# Patient Record
Sex: Male | Born: 1965
Health system: Southern US, Community
[De-identification: ages and names within clinical notes are randomized; demographics above are authoritative.]

## PROBLEM LIST (undated history)

## (undated) DIAGNOSIS — K219 Gastro-esophageal reflux disease without esophagitis: Secondary | ICD-10-CM

## (undated) DIAGNOSIS — F17200 Nicotine dependence, unspecified, uncomplicated: Secondary | ICD-10-CM

## (undated) DIAGNOSIS — I10 Essential (primary) hypertension: Secondary | ICD-10-CM

## (undated) DIAGNOSIS — E785 Hyperlipidemia, unspecified: Secondary | ICD-10-CM

## (undated) HISTORY — DX: Hyperlipidemia, unspecified: E78.5

## (undated) HISTORY — DX: Nicotine dependence, unspecified, uncomplicated: F17.200

---

## 1998-07-29 ENCOUNTER — Emergency Department (HOSPITAL_COMMUNITY): Admission: EM | Admit: 1998-07-29 | Discharge: 1998-07-29 | Payer: Self-pay | Admitting: Emergency Medicine

## 1999-03-25 ENCOUNTER — Encounter: Payer: Self-pay | Admitting: Internal Medicine

## 1999-03-25 ENCOUNTER — Emergency Department (HOSPITAL_COMMUNITY): Admission: EM | Admit: 1999-03-25 | Discharge: 1999-03-25 | Payer: Self-pay | Admitting: Internal Medicine

## 2001-02-03 ENCOUNTER — Emergency Department (HOSPITAL_COMMUNITY): Admission: EM | Admit: 2001-02-03 | Discharge: 2001-02-03 | Payer: Self-pay | Admitting: Internal Medicine

## 2001-05-15 ENCOUNTER — Encounter: Payer: Self-pay | Admitting: Emergency Medicine

## 2001-05-15 ENCOUNTER — Emergency Department (HOSPITAL_COMMUNITY): Admission: EM | Admit: 2001-05-15 | Discharge: 2001-05-15 | Payer: Self-pay | Admitting: Emergency Medicine

## 2002-09-14 ENCOUNTER — Emergency Department (HOSPITAL_COMMUNITY): Admission: EM | Admit: 2002-09-14 | Discharge: 2002-09-14 | Payer: Self-pay | Admitting: Podiatry

## 2002-09-14 ENCOUNTER — Encounter: Payer: Self-pay | Admitting: *Deleted

## 2002-10-28 ENCOUNTER — Emergency Department (HOSPITAL_COMMUNITY): Admission: EM | Admit: 2002-10-28 | Discharge: 2002-10-28 | Payer: Self-pay | Admitting: Emergency Medicine

## 2003-12-25 ENCOUNTER — Emergency Department (HOSPITAL_COMMUNITY): Admission: EM | Admit: 2003-12-25 | Discharge: 2003-12-26 | Payer: Self-pay | Admitting: Emergency Medicine

## 2004-10-29 ENCOUNTER — Emergency Department (HOSPITAL_COMMUNITY): Admission: EM | Admit: 2004-10-29 | Discharge: 2004-10-29 | Payer: Self-pay | Admitting: Emergency Medicine

## 2004-12-02 ENCOUNTER — Emergency Department (HOSPITAL_COMMUNITY): Admission: EM | Admit: 2004-12-02 | Discharge: 2004-12-02 | Payer: Self-pay | Admitting: Emergency Medicine

## 2005-02-16 ENCOUNTER — Emergency Department (HOSPITAL_COMMUNITY): Admission: EM | Admit: 2005-02-16 | Discharge: 2005-02-17 | Payer: Self-pay | Admitting: Emergency Medicine

## 2005-05-20 ENCOUNTER — Emergency Department (HOSPITAL_COMMUNITY): Admission: EM | Admit: 2005-05-20 | Discharge: 2005-05-20 | Payer: Self-pay | Admitting: Emergency Medicine

## 2005-07-19 ENCOUNTER — Ambulatory Visit: Payer: Self-pay | Admitting: Family Medicine

## 2005-07-28 ENCOUNTER — Ambulatory Visit (HOSPITAL_COMMUNITY): Admission: RE | Admit: 2005-07-28 | Discharge: 2005-07-28 | Payer: Self-pay | Admitting: Family Medicine

## 2005-08-06 ENCOUNTER — Ambulatory Visit: Payer: Self-pay | Admitting: *Deleted

## 2005-08-17 ENCOUNTER — Emergency Department (HOSPITAL_COMMUNITY): Admission: EM | Admit: 2005-08-17 | Discharge: 2005-08-17 | Payer: Self-pay | Admitting: Emergency Medicine

## 2005-11-01 ENCOUNTER — Ambulatory Visit: Payer: Self-pay | Admitting: Nurse Practitioner

## 2006-01-22 ENCOUNTER — Emergency Department (HOSPITAL_COMMUNITY): Admission: EM | Admit: 2006-01-22 | Discharge: 2006-01-22 | Payer: Self-pay | Admitting: Emergency Medicine

## 2006-01-25 ENCOUNTER — Ambulatory Visit: Payer: Self-pay | Admitting: Family Medicine

## 2006-02-01 ENCOUNTER — Ambulatory Visit: Payer: Self-pay | Admitting: Family Medicine

## 2006-04-11 ENCOUNTER — Ambulatory Visit: Payer: Self-pay | Admitting: Family Medicine

## 2006-08-08 ENCOUNTER — Emergency Department (HOSPITAL_COMMUNITY): Admission: EM | Admit: 2006-08-08 | Discharge: 2006-08-08 | Payer: Self-pay | Admitting: Emergency Medicine

## 2006-08-08 ENCOUNTER — Ambulatory Visit: Payer: Self-pay | Admitting: Family Medicine

## 2006-08-09 ENCOUNTER — Ambulatory Visit: Payer: Self-pay | Admitting: Family Medicine

## 2006-08-10 ENCOUNTER — Emergency Department (HOSPITAL_COMMUNITY): Admission: EM | Admit: 2006-08-10 | Discharge: 2006-08-10 | Payer: Self-pay | Admitting: *Deleted

## 2006-08-15 ENCOUNTER — Ambulatory Visit: Payer: Self-pay | Admitting: Family Medicine

## 2007-06-28 ENCOUNTER — Encounter (INDEPENDENT_AMBULATORY_CARE_PROVIDER_SITE_OTHER): Payer: Self-pay | Admitting: *Deleted

## 2008-07-09 ENCOUNTER — Emergency Department (HOSPITAL_COMMUNITY): Admission: EM | Admit: 2008-07-09 | Discharge: 2008-07-09 | Payer: Self-pay | Admitting: *Deleted

## 2008-12-31 ENCOUNTER — Emergency Department (HOSPITAL_COMMUNITY): Admission: EM | Admit: 2008-12-31 | Discharge: 2008-12-31 | Payer: Self-pay | Admitting: Emergency Medicine

## 2009-12-17 ENCOUNTER — Emergency Department (HOSPITAL_COMMUNITY): Admission: EM | Admit: 2009-12-17 | Discharge: 2009-12-17 | Payer: Self-pay | Admitting: Emergency Medicine

## 2010-09-17 ENCOUNTER — Emergency Department (HOSPITAL_COMMUNITY): Admission: EM | Admit: 2010-09-17 | Discharge: 2010-07-03 | Payer: Self-pay | Admitting: Emergency Medicine

## 2010-10-31 ENCOUNTER — Encounter: Payer: Self-pay | Admitting: Family Medicine

## 2010-11-01 ENCOUNTER — Encounter: Payer: Self-pay | Admitting: Family Medicine

## 2010-12-24 LAB — URINE MICROSCOPIC-ADD ON

## 2010-12-24 LAB — URINALYSIS, ROUTINE W REFLEX MICROSCOPIC
Ketones, ur: >80 mg/dL — AB
Nitrite: NEGATIVE
Specific Gravity, Urine: 1.032 — ABNORMAL HIGH (ref 1.005–1.030)
pH: 6 (ref 5.0–8.0)

## 2010-12-24 LAB — CBC
MCH: 34.1 pg — ABNORMAL HIGH (ref 26.0–34.0)
MCHC: 34.5 g/dL (ref 30.0–36.0)
MCV: 99.1 fL (ref 78.0–100.0)
Platelets: 195 10*3/uL (ref 150–400)
RBC: 4.79 MIL/uL (ref 4.22–5.81)
RDW: 12.1 % (ref 11.5–15.5)

## 2010-12-24 LAB — URINE CULTURE: Culture: NO GROWTH

## 2010-12-24 LAB — DIFFERENTIAL
Basophils Relative: 0 % (ref 0–1)
Eosinophils Absolute: 0 10*3/uL (ref 0.0–0.7)
Eosinophils Relative: 0 % (ref 0–5)
Lymphs Abs: 2.2 10*3/uL (ref 0.7–4.0)
Neutrophils Relative %: 68 % (ref 43–77)

## 2010-12-24 LAB — COMPREHENSIVE METABOLIC PANEL
AST: 15 U/L (ref 0–37)
CO2: 26 mEq/L (ref 19–32)
Calcium: 9.7 mg/dL (ref 8.4–10.5)
Creatinine, Ser: 1.04 mg/dL (ref 0.4–1.5)
GFR calc Af Amer: >60 mL/min (ref >60)
GFR calc non Af Amer: >60 mL/min (ref >60)
Glucose, Bld: 103 mg/dL — ABNORMAL HIGH (ref 70–99)
Sodium: 138 mEq/L (ref 135–145)
Total Protein: 7.7 g/dL (ref 6.0–8.3)

## 2010-12-24 LAB — LIPASE, BLOOD: Lipase: 39 U/L (ref 11–59)

## 2011-01-21 LAB — CBC
HCT: 47.5 % (ref 39.0–52.0)
Hemoglobin: 16.1 g/dL (ref 13.0–17.0)
RBC: 4.76 MIL/uL (ref 4.22–5.81)

## 2011-01-21 LAB — COMPREHENSIVE METABOLIC PANEL
ALT: 11 U/L (ref 0–53)
Alkaline Phosphatase: 51 U/L (ref 39–117)
BUN: 13 mg/dL (ref 6–23)
CO2: 28 mEq/L (ref 19–32)
GFR calc non Af Amer: >60 mL/min (ref >60)
Glucose, Bld: 96 mg/dL (ref 70–99)
Potassium: 3.5 mEq/L (ref 3.5–5.1)
Sodium: 140 mEq/L (ref 135–145)
Total Bilirubin: 1.2 mg/dL (ref 0.3–1.2)

## 2011-01-21 LAB — LIPASE, BLOOD: Lipase: 32 U/L (ref 11–59)

## 2011-01-21 LAB — URINE CULTURE

## 2011-01-21 LAB — URINALYSIS, ROUTINE W REFLEX MICROSCOPIC
Glucose, UA: NEGATIVE mg/dL
Ketones, ur: >80 mg/dL — AB
Nitrite: NEGATIVE
Protein, ur: 30 mg/dL — AB
Urobilinogen, UA: 1 mg/dL (ref 0.0–1.0)

## 2011-01-21 LAB — URINE MICROSCOPIC-ADD ON

## 2011-01-21 LAB — DIFFERENTIAL
Basophils Absolute: 0 10*3/uL (ref 0.0–0.1)
Basophils Relative: 0 % (ref 0–1)
Eosinophils Absolute: 0 10*3/uL (ref 0.0–0.7)
Neutrophils Relative %: 72 % (ref 43–77)

## 2011-06-12 ENCOUNTER — Emergency Department (HOSPITAL_COMMUNITY)
Admission: EM | Admit: 2011-06-12 | Discharge: 2011-06-12 | Disposition: A | Discharge status: Home / Self Care | Payer: Self-pay | Attending: Emergency Medicine | Admitting: Emergency Medicine

## 2011-06-12 ENCOUNTER — Emergency Department (HOSPITAL_COMMUNITY): Payer: Self-pay

## 2011-06-12 DIAGNOSIS — R51 Headache: Secondary | ICD-10-CM | POA: Insufficient documentation

## 2011-06-12 DIAGNOSIS — S0100XA Unspecified open wound of scalp, initial encounter: Secondary | ICD-10-CM | POA: Insufficient documentation

## 2011-06-12 DIAGNOSIS — Z23 Encounter for immunization: Secondary | ICD-10-CM | POA: Insufficient documentation

## 2011-06-12 DIAGNOSIS — I10 Essential (primary) hypertension: Secondary | ICD-10-CM | POA: Insufficient documentation

## 2011-06-12 DIAGNOSIS — S0990XA Unspecified injury of head, initial encounter: Secondary | ICD-10-CM | POA: Insufficient documentation

## 2011-06-20 ENCOUNTER — Emergency Department (HOSPITAL_COMMUNITY)
Admission: EM | Admit: 2011-06-20 | Discharge: 2011-06-20 | Disposition: A | Discharge status: Home / Self Care | Payer: 59 | Attending: Emergency Medicine | Admitting: Emergency Medicine

## 2011-06-20 DIAGNOSIS — Z4802 Encounter for removal of sutures: Secondary | ICD-10-CM | POA: Insufficient documentation

## 2011-07-12 LAB — COMPREHENSIVE METABOLIC PANEL
Albumin: 4.1
BUN: 6
Calcium: 9.6
Creatinine, Ser: 1.03
Total Protein: 7.3

## 2011-07-12 LAB — DIFFERENTIAL
Basophils Absolute: 0
Lymphocytes Relative: 25
Monocytes Absolute: 0.6
Monocytes Relative: 7
Neutro Abs: 5.8
Neutrophils Relative %: 66

## 2011-07-12 LAB — URINALYSIS, ROUTINE W REFLEX MICROSCOPIC
Glucose, UA: NEGATIVE
Hgb urine dipstick: NEGATIVE
Specific Gravity, Urine: 1.016
Urobilinogen, UA: 1

## 2011-07-12 LAB — CBC
HCT: 47.8
MCHC: 33.7
MCV: 100.7 — ABNORMAL HIGH
Platelets: 187
RDW: 12.3

## 2011-07-12 LAB — URINE MICROSCOPIC-ADD ON

## 2011-08-13 ENCOUNTER — Emergency Department (HOSPITAL_COMMUNITY): Payer: 59

## 2011-08-13 ENCOUNTER — Emergency Department (HOSPITAL_COMMUNITY)
Admission: EM | Admit: 2011-08-13 | Discharge: 2011-08-13 | Disposition: A | Discharge status: Home / Self Care | Payer: 59 | Attending: Emergency Medicine | Admitting: Emergency Medicine

## 2011-08-13 DIAGNOSIS — R109 Unspecified abdominal pain: Secondary | ICD-10-CM | POA: Insufficient documentation

## 2011-08-13 DIAGNOSIS — K2970 Gastritis, unspecified, without bleeding: Secondary | ICD-10-CM | POA: Insufficient documentation

## 2011-08-13 DIAGNOSIS — K2990 Gastroduodenitis, unspecified, without bleeding: Secondary | ICD-10-CM | POA: Insufficient documentation

## 2011-08-13 LAB — CBC
HCT: 46.3 % (ref 39.0–52.0)
MCHC: 35.6 g/dL (ref 30.0–36.0)
Platelets: 197 10*3/uL (ref 150–400)
RDW: 11.9 % (ref 11.5–15.5)
WBC: 7.2 10*3/uL (ref 4.0–10.5)

## 2011-08-13 LAB — DIFFERENTIAL
Basophils Absolute: 0 10*3/uL (ref 0.0–0.1)
Basophils Relative: 1 % (ref 0–1)
Eosinophils Relative: 2 % (ref 0–5)
Lymphocytes Relative: 39 % (ref 12–46)
Monocytes Absolute: 0.7 10*3/uL (ref 0.1–1.0)

## 2011-08-13 LAB — COMPREHENSIVE METABOLIC PANEL
ALT: 9 U/L (ref 0–53)
AST: 12 U/L (ref 0–37)
Albumin: 4 g/dL (ref 3.5–5.2)
Alkaline Phosphatase: 61 U/L (ref 39–117)
BUN: 8 mg/dL (ref 6–23)
Chloride: 99 mEq/L (ref 96–112)
Potassium: 4 mEq/L (ref 3.5–5.1)
Sodium: 137 mEq/L (ref 135–145)
Total Bilirubin: 0.3 mg/dL (ref 0.3–1.2)
Total Protein: 7.3 g/dL (ref 6.0–8.3)

## 2011-08-13 LAB — LIPASE, BLOOD: Lipase: 49 U/L (ref 11–59)

## 2011-08-13 MED ORDER — IOHEXOL 300 MG/ML  SOLN
100.0000 mL | Freq: Once | INTRAMUSCULAR | Status: AC | PRN
  Administered 2011-08-13: 100 mL via INTRAVENOUS

## 2011-10-13 ENCOUNTER — Emergency Department (HOSPITAL_COMMUNITY)
Admission: EM | Admit: 2011-10-13 | Discharge: 2011-10-13 | Disposition: A | Discharge status: Home / Self Care | Payer: 59 | Attending: Emergency Medicine | Admitting: Emergency Medicine

## 2011-10-13 ENCOUNTER — Encounter: Payer: Self-pay | Admitting: *Deleted

## 2011-10-13 DIAGNOSIS — R197 Diarrhea, unspecified: Secondary | ICD-10-CM | POA: Insufficient documentation

## 2011-10-13 DIAGNOSIS — R112 Nausea with vomiting, unspecified: Secondary | ICD-10-CM | POA: Insufficient documentation

## 2011-10-13 DIAGNOSIS — F17200 Nicotine dependence, unspecified, uncomplicated: Secondary | ICD-10-CM | POA: Insufficient documentation

## 2011-10-13 DIAGNOSIS — IMO0001 Reserved for inherently not codable concepts without codable children: Secondary | ICD-10-CM | POA: Insufficient documentation

## 2011-10-13 MED ORDER — ONDANSETRON 8 MG PO TBDP
8.0000 mg | ORAL_TABLET | Freq: Once | ORAL | Status: AC
  Administered 2011-10-13: 8 mg via ORAL
  Filled 2011-10-13: qty 1

## 2011-10-13 MED ORDER — LOPERAMIDE HCL 2 MG PO CAPS
2.0000 mg | ORAL_CAPSULE | Freq: Once | ORAL | Status: AC
  Administered 2011-10-13: 2 mg via ORAL
  Filled 2011-10-13: qty 1

## 2011-10-13 MED ORDER — ONDANSETRON HCL 8 MG PO TABS: 8.0000 mg | ORAL_TABLET | Freq: Three times a day (TID) | ORAL | Status: AC | PRN

## 2011-10-13 NOTE — ED Notes (Signed)
Pt in c/o flu like symptoms, fever, chills, body aches, n/v/d

## 2011-10-13 NOTE — ED Notes (Signed)
Po fluids offered, abled to drink 240 ml fluids, tolerated well, denies nausea, denies pain (-) vomiting

## 2011-10-13 NOTE — ED Provider Notes (Signed)
History     CSN: [id number]  Arrival date & time 10/13/11  0551   First MD Initiated Contact with Patient 10/13/11 0615      Chief Complaint  Patient presents with  . Influenza    (Consider location/radiation/quality/duration/timing/severity/associated sxs/prior treatment) The history is provided by the patient.   Pt c/o nvd onset last pm. Has had a few episodes of both vomiting and diarrhea. Emesis clear, or c/w recently ingested fluids - not bloody or bilious. Diarrhea watery, not bloody. No abd pain. +body aches. No fever. No gu c/o. Says others at work w recent similar gi symptoms. No known bad food ingestion. No recent abx use. Denies cough, sore throat, or other uri symptoms.     History reviewed. No pertinent past medical history.  History reviewed. No pertinent past surgical history.  History reviewed. No pertinent family history.  History  Substance Use Topics  . Smoking status: Current Everyday Smoker  . Smokeless tobacco: Not on file  . Alcohol Use: No      Review of Systems  Constitutional: Negative for fever.  HENT: Negative for sore throat, rhinorrhea and neck pain.   Eyes: Negative for redness.  Respiratory: Negative for cough and shortness of breath.   Cardiovascular: Negative for chest pain.  Gastrointestinal: Positive for nausea, vomiting and diarrhea. Negative for abdominal pain.  Genitourinary: Negative for flank pain.  Musculoskeletal: Negative for back pain.  Skin: Negative for rash.  Neurological: Negative for headaches.  Hematological: Does not bruise/bleed easily.  Psychiatric/Behavioral: Negative for confusion.    Allergies  Review of patient's allergies indicates no known allergies.  Home Medications  No current outpatient prescriptions on file.  There were no vitals taken for this visit.  Physical Exam  Nursing note and vitals reviewed. Constitutional: He is oriented to person, place, and time. He appears well-developed and  well-nourished. No distress.  HENT:  Head: Atraumatic.  Mouth/Throat: Oropharynx is clear and moist.  Eyes: Conjunctivae are normal. Pupils are equal, round, and reactive to light.  Neck: Neck supple. No tracheal deviation present.       No stiffness or rigidity  Cardiovascular: Normal rate, regular rhythm, normal heart sounds and intact distal pulses.  Exam reveals no gallop and no friction rub.   No murmur heard. Pulmonary/Chest: Effort normal and breath sounds normal. No accessory muscle usage. No respiratory distress. He has no rales.  Abdominal: Soft. Bowel sounds are normal. He exhibits no distension and no mass. There is no tenderness. There is no rebound and no guarding.  Genitourinary:       No cva tenderness  Musculoskeletal: Normal range of motion. He exhibits no edema and no tenderness.  Neurological: He is alert and oriented to person, place, and time.  Skin: Skin is warm and dry.  Psychiatric: He has a normal mood and affect.    ED Course  Procedures (including critical care time)     MDM  zofran po. Imodium po. Po fluids. Discussed zofran, oral hydration with pt. abd soft nt. No gu c/o.         Suzi Roots, MD 10/14/11 [phone]

## 2012-04-24 ENCOUNTER — Encounter (HOSPITAL_COMMUNITY): Payer: Self-pay | Admitting: Emergency Medicine

## 2012-04-24 ENCOUNTER — Emergency Department (HOSPITAL_COMMUNITY)
Admission: EM | Admit: 2012-04-24 | Discharge: 2012-04-24 | Disposition: A | Discharge status: Home / Self Care | Payer: 59 | Attending: Emergency Medicine | Admitting: Emergency Medicine

## 2012-04-24 DIAGNOSIS — R5381 Other malaise: Secondary | ICD-10-CM | POA: Insufficient documentation

## 2012-04-24 DIAGNOSIS — R110 Nausea: Secondary | ICD-10-CM | POA: Insufficient documentation

## 2012-04-24 DIAGNOSIS — R109 Unspecified abdominal pain: Secondary | ICD-10-CM

## 2012-04-24 DIAGNOSIS — R1013 Epigastric pain: Secondary | ICD-10-CM | POA: Insufficient documentation

## 2012-04-24 DIAGNOSIS — F17200 Nicotine dependence, unspecified, uncomplicated: Secondary | ICD-10-CM | POA: Insufficient documentation

## 2012-04-24 LAB — CBC WITH DIFFERENTIAL/PLATELET
Basophils Absolute: 0 10*3/uL (ref 0.0–0.1)
Basophils Relative: 1 % (ref 0–1)
Eosinophils Relative: 2 % (ref 0–5)
HCT: 43.4 % (ref 39.0–52.0)
Hemoglobin: 15.2 g/dL (ref 13.0–17.0)
MCH: 33.6 pg (ref 26.0–34.0)
MCHC: 35 g/dL (ref 30.0–36.0)
MCV: 96 fL (ref 78.0–100.0)
Monocytes Absolute: 0.6 10*3/uL (ref 0.1–1.0)
Monocytes Relative: 9 % (ref 3–12)
Neutro Abs: 3.5 10*3/uL (ref 1.7–7.7)
RDW: 11.7 % (ref 11.5–15.5)

## 2012-04-24 LAB — LIPASE, BLOOD: Lipase: 39 U/L (ref 11–59)

## 2012-04-24 LAB — COMPREHENSIVE METABOLIC PANEL
Albumin: 3.7 g/dL (ref 3.5–5.2)
BUN: 8 mg/dL (ref 6–23)
CO2: 27 mEq/L (ref 19–32)
Calcium: 9.1 mg/dL (ref 8.4–10.5)
Chloride: 104 mEq/L (ref 96–112)
Creatinine, Ser: 1.09 mg/dL (ref 0.50–1.35)
GFR calc non Af Amer: 81 mL/min — ABNORMAL LOW (ref >90)
Total Bilirubin: 0.2 mg/dL — ABNORMAL LOW (ref 0.3–1.2)

## 2012-04-24 MED ORDER — ONDANSETRON HCL 4 MG/2ML IJ SOLN
4.0000 mg | Freq: Once | INTRAMUSCULAR | Status: AC
  Administered 2012-04-24: 4 mg via INTRAVENOUS
  Filled 2012-04-24: qty 2

## 2012-04-24 MED ORDER — SODIUM CHLORIDE 0.9 % IV BOLUS (SEPSIS)
1000.0000 mL | Freq: Once | INTRAVENOUS | Status: AC
  Administered 2012-04-24: 1000 mL via INTRAVENOUS

## 2012-04-24 MED ORDER — HYDROCODONE-ACETAMINOPHEN 5-500 MG PO TABS: 1.0000 | ORAL_TABLET | Freq: Four times a day (QID) | ORAL | Status: AC | PRN

## 2012-04-24 MED ORDER — GI COCKTAIL ~~LOC~~
30.0000 mL | Freq: Once | ORAL | Status: AC
  Administered 2012-04-24: 30 mL via ORAL
  Filled 2012-04-24: qty 30

## 2012-04-24 MED ORDER — PANTOPRAZOLE SODIUM 40 MG IV SOLR
40.0000 mg | Freq: Once | INTRAVENOUS | Status: AC
  Administered 2012-04-24: 40 mg via INTRAVENOUS
  Filled 2012-04-24: qty 40

## 2012-04-24 MED ORDER — FAMOTIDINE 20 MG PO TABS: 20.0000 mg | ORAL_TABLET | Freq: Two times a day (BID) | ORAL | Status: DC

## 2012-04-24 MED ORDER — MORPHINE SULFATE 4 MG/ML IJ SOLN
4.0000 mg | Freq: Once | INTRAMUSCULAR | Status: AC
  Administered 2012-04-24: 4 mg via INTRAVENOUS
  Filled 2012-04-24: qty 1

## 2012-04-24 MED ORDER — FAMOTIDINE IN NACL 20-0.9 MG/50ML-% IV SOLN
20.0000 mg | Freq: Once | INTRAVENOUS | Status: AC
  Administered 2012-04-24: 20 mg via INTRAVENOUS
  Filled 2012-04-24: qty 50

## 2012-04-24 NOTE — ED Provider Notes (Signed)
Medical screening examination/treatment/procedure(s) were performed by non-physician practitioner and as supervising physician I was immediately available for consultation/collaboration.   Dione Booze, MD 04/24/12 [phone]

## 2012-04-24 NOTE — ED Notes (Signed)
Pt reports he was having abd pain. Denies pain at moment. Reports his pain comes and goes.

## 2012-04-24 NOTE — ED Notes (Signed)
Pt. Resting with eyes closed. 

## 2012-04-24 NOTE — ED Provider Notes (Signed)
History     CSN: [id number]  Arrival date & time 04/24/12  0704   First MD Initiated Contact with Patient 04/24/12 [phone]      Chief Complaint  Patient presents with  . Abdominal Pain    (Consider location/radiation/quality/duration/timing/severity/associated sxs/prior treatment) Patient is a 46 y.o. male presenting with abdominal pain. The history is provided by the patient.  Abdominal Pain The primary symptoms of the illness include abdominal pain, fatigue and nausea. The primary symptoms of the illness do not include fever, shortness of breath, vomiting, diarrhea, hematemesis or dysuria. The current episode started more than 2 days ago. The onset of the illness was gradual. The problem has been gradually worsening.  Symptoms associated with the illness do not include chills, hematuria or frequency.  States pain in the epigastric area. States no alcohol or NSAID use. Hx of the same, last year, states was told he had gastritis. takeing prilosec with no improvement. States pain for 3 days now, worsening. Not eating. Stool normal color. No fever or vomiting.   History reviewed. No pertinent past medical history.  History reviewed. No pertinent past surgical history.  History reviewed. No pertinent family history.  History  Substance Use Topics  . Smoking status: Current Everyday Smoker  . Smokeless tobacco: Not on file  . Alcohol Use: No      Review of Systems  Constitutional: Positive for fatigue. Negative for fever and chills.  HENT: Negative for neck pain and neck stiffness.   Respiratory: Negative for cough, chest tightness and shortness of breath.   Cardiovascular: Negative.   Gastrointestinal: Positive for nausea and abdominal pain. Negative for vomiting, diarrhea and hematemesis.  Genitourinary: Negative for dysuria, frequency, hematuria, flank pain and penile pain.  Musculoskeletal: Positive for myalgias.  Skin: Negative.   Neurological: Negative for dizziness,  weakness and numbness.    Allergies  Review of patient's allergies indicates no known allergies.  Home Medications   Current Outpatient Rx  Name Route Sig Dispense Refill  . OMEPRAZOLE 20 MG PO CPDR Oral Take 20 mg by mouth daily.      BP 178/94  Pulse 59  Temp 98.2 F (36.8 C) (Oral)  Resp 16  SpO2 100%  Physical Exam  Nursing note and vitals reviewed. Constitutional: He is oriented to person, place, and time. He appears well-developed and well-nourished. No distress.  Eyes: Conjunctivae are normal.  Neck: Neck supple.  Cardiovascular: Normal rate, regular rhythm and normal heart sounds.   Pulmonary/Chest: Effort normal and breath sounds normal. No respiratory distress. He has no wheezes. He has no rales.  Abdominal: Soft. Bowel sounds are normal. There is no rebound.       Epigastric and LUQ tenderness, guarding  Musculoskeletal: Normal range of motion. He exhibits no edema.  Neurological: He is alert and oriented to person, place, and time.  Skin: Skin is warm.  Psychiatric: He has a normal mood and affect.    ED Course  Procedures (including critical care time)  Epigastric abdominal pain, hx of gastritis. Will get labs, meds ordered.   Results for orders placed during the hospital encounter of 04/24/12  CBC WITH DIFFERENTIAL      Component Value Range   WBC 6.8  4.0 - 10.5 K/uL   RBC 4.52  4.22 - 5.81 MIL/uL   Hemoglobin 15.2  13.0 - 17.0 g/dL   HCT 11.9  14.7 - 82.9 %   MCV 96.0  78.0 - 100.0 fL   MCH 33.6  26.0 -  34.0 pg   MCHC 35.0  30.0 - 36.0 g/dL   RDW 16.1  09.6 - 04.5 %   Platelets 199  150 - 400 K/uL   Neutrophils Relative 51  43 - 77 %   Neutro Abs 3.5  1.7 - 7.7 K/uL   Lymphocytes Relative 37  12 - 46 %   Lymphs Abs 2.5  0.7 - 4.0 K/uL   Monocytes Relative 9  3 - 12 %   Monocytes Absolute 0.6  0.1 - 1.0 K/uL   Eosinophils Relative 2  0 - 5 %   Eosinophils Absolute 0.1  0.0 - 0.7 K/uL   Basophils Relative 1  0 - 1 %   Basophils Absolute 0.0   0.0 - 0.1 K/uL  COMPREHENSIVE METABOLIC PANEL      Component Value Range   Sodium 141  135 - 145 mEq/L   Potassium 3.8  3.5 - 5.1 mEq/L   Chloride 104  96 - 112 mEq/L   CO2 27  19 - 32 mEq/L   Glucose, Bld 92  70 - 99 mg/dL   BUN 8  6 - 23 mg/dL   Creatinine, Ser 4.09  0.50 - 1.35 mg/dL   Calcium 9.1  8.4 - 81.1 mg/dL   Total Protein 6.8  6.0 - 8.3 g/dL   Albumin 3.7  3.5 - 5.2 g/dL   AST 11  0 - 37 U/L   ALT 7  0 - 53 U/L   Alkaline Phosphatase 56  39 - 117 U/L   Total Bilirubin 0.2 (*) 0.3 - 1.2 mg/dL   GFR calc non Af Amer 81 (*) >90 mL/min   GFR calc Af Amer >90  >90 mL/min  LIPASE, BLOOD      Component Value Range   Lipase 39  11 - 59 U/L   Pt's pain comes and goes, only lasting few minutes at a time then resolves. Doubt cholecystitis or pancreatitis given this type of pain, normal VS and normal labs. Pain is currently 0/10. Pt comfortable going home. Will d/c home with pain medications and follow up. Abdomen re examine at time of discharge and is non tender. No acute abdomen.    1. Abdominal pain       MDM         Lottie Mussel, PA 04/24/12 1539

## 2012-04-24 NOTE — ED Notes (Signed)
Pt. Stated, I've had abd. Pain since Friday that really hurts. Normal BM , no  Urinary problem.

## 2012-06-07 ENCOUNTER — Encounter (HOSPITAL_COMMUNITY): Payer: Self-pay | Admitting: Family Medicine

## 2012-06-07 ENCOUNTER — Emergency Department (HOSPITAL_COMMUNITY)
Admission: EM | Admit: 2012-06-07 | Discharge: 2012-06-07 | Disposition: A | Discharge status: Home / Self Care | Payer: 59 | Attending: Emergency Medicine | Admitting: Emergency Medicine

## 2012-06-07 DIAGNOSIS — R112 Nausea with vomiting, unspecified: Secondary | ICD-10-CM | POA: Insufficient documentation

## 2012-06-07 DIAGNOSIS — Z79899 Other long term (current) drug therapy: Secondary | ICD-10-CM | POA: Insufficient documentation

## 2012-06-07 DIAGNOSIS — K219 Gastro-esophageal reflux disease without esophagitis: Secondary | ICD-10-CM | POA: Insufficient documentation

## 2012-06-07 DIAGNOSIS — R1013 Epigastric pain: Secondary | ICD-10-CM | POA: Insufficient documentation

## 2012-06-07 DIAGNOSIS — F17200 Nicotine dependence, unspecified, uncomplicated: Secondary | ICD-10-CM | POA: Insufficient documentation

## 2012-06-07 DIAGNOSIS — R109 Unspecified abdominal pain: Secondary | ICD-10-CM

## 2012-06-07 LAB — COMPREHENSIVE METABOLIC PANEL
Albumin: 4.4 g/dL (ref 3.5–5.2)
Alkaline Phosphatase: 68 U/L (ref 39–117)
BUN: 13 mg/dL (ref 6–23)
CO2: 29 mEq/L (ref 19–32)
Chloride: 102 mEq/L (ref 96–112)
Creatinine, Ser: 1.16 mg/dL (ref 0.50–1.35)
GFR calc Af Amer: 87 mL/min — ABNORMAL LOW (ref >90)
GFR calc non Af Amer: 75 mL/min — ABNORMAL LOW (ref >90)
Glucose, Bld: 110 mg/dL — ABNORMAL HIGH (ref 70–99)
Potassium: 3.8 mEq/L (ref 3.5–5.1)
Total Bilirubin: 0.5 mg/dL (ref 0.3–1.2)

## 2012-06-07 LAB — CBC WITH DIFFERENTIAL/PLATELET
Basophils Relative: 0 % (ref 0–1)
HCT: 48.8 % (ref 39.0–52.0)
Hemoglobin: 17.3 g/dL — ABNORMAL HIGH (ref 13.0–17.0)
Lymphocytes Relative: 28 % (ref 12–46)
Lymphs Abs: 2.7 10*3/uL (ref 0.7–4.0)
MCHC: 35.5 g/dL (ref 30.0–36.0)
Monocytes Absolute: 0.9 10*3/uL (ref 0.1–1.0)
Monocytes Relative: 9 % (ref 3–12)
Neutro Abs: 6.2 10*3/uL (ref 1.7–7.7)
Neutrophils Relative %: 63 % (ref 43–77)
RBC: 5.14 MIL/uL (ref 4.22–5.81)

## 2012-06-07 LAB — URINALYSIS, ROUTINE W REFLEX MICROSCOPIC
Glucose, UA: NEGATIVE mg/dL
Ketones, ur: 40 mg/dL — AB
Protein, ur: 30 mg/dL — AB
Urobilinogen, UA: 1 mg/dL (ref 0.0–1.0)

## 2012-06-07 LAB — URINE MICROSCOPIC-ADD ON

## 2012-06-07 LAB — LIPASE, BLOOD: Lipase: 36 U/L (ref 11–59)

## 2012-06-07 MED ORDER — ONDANSETRON 4 MG PO TBDP
4.0000 mg | ORAL_TABLET | Freq: Once | ORAL | Status: AC
  Administered 2012-06-07: 4 mg via ORAL

## 2012-06-07 MED ORDER — SODIUM CHLORIDE 0.9 % IV BOLUS (SEPSIS)
1000.0000 mL | Freq: Once | INTRAVENOUS | Status: AC
Start: 2012-06-07 — End: 2012-06-07
  Administered 2012-06-07: 1000 mL via INTRAVENOUS

## 2012-06-07 MED ORDER — HYDROMORPHONE HCL PF 1 MG/ML IJ SOLN
1.0000 mg | Freq: Once | INTRAMUSCULAR | Status: AC
  Administered 2012-06-07: 1 mg via INTRAVENOUS
  Filled 2012-06-07: qty 1

## 2012-06-07 MED ORDER — PANTOPRAZOLE SODIUM 40 MG PO TBEC
40.0000 mg | DELAYED_RELEASE_TABLET | Freq: Every day | ORAL | Status: DC
  Administered 2012-06-07: 40 mg via ORAL
  Filled 2012-06-07: qty 1

## 2012-06-07 MED ORDER — OMEPRAZOLE 20 MG PO CPDR: 20.0000 mg | DELAYED_RELEASE_CAPSULE | Freq: Every day | ORAL | Status: DC

## 2012-06-07 MED ORDER — ONDANSETRON 4 MG PO TBDP
ORAL_TABLET | ORAL | Status: AC
  Filled 2012-06-07: qty 1

## 2012-06-07 NOTE — ED Notes (Signed)
Pt actively vomiting in waiting room; zofran given.

## 2012-06-07 NOTE — ED Notes (Signed)
Pt unable to urinate at this time.  Urinal and specimen cup at bedside.  States will try in a while

## 2012-06-07 NOTE — ED Provider Notes (Signed)
History     CSN: [id number]  Arrival date & time 06/07/12  1600   First MD Initiated Contact with Patient 06/07/12 2059      Chief Complaint  Patient presents with  . Abdominal Pain    (Consider location/radiation/quality/duration/timing/severity/associated sxs/prior treatment) HPI Comments: Todd Allen 46 y.o. male   The chief complaint is: Patient presents with:   Abdominal Pain   The patient has medical history significant for:   History reviewed. No pertinent past medical history.  Patient presents with a complain for abdominal pain, nausea, and vomiting present for two days. Associated symptoms include anorexia. Patient was seen in ED by Dr. Preston Fleeting for similar complain who prescribed him omeprazole at discharge. Patient states that when he takes omeprazole he feels fine, but if he runs out the pain comes back. Pain is rated 10/10 without radiation or transmission. Denies hematemeses, melena, or hematochezia. Denies change in bowel pattern. Denies abdominal surgeries. Reports subjective fever and chills. Denies CP, palpitations or SOB.       The history is provided by the patient.    History reviewed. No pertinent past medical history.  History reviewed. No pertinent past surgical history.  History reviewed. No pertinent family history.  History  Substance Use Topics  . Smoking status: Current Everyday Smoker  . Smokeless tobacco: Not on file  . Alcohol Use: No      Review of Systems  Constitutional: Positive for fever and chills.  Respiratory: Negative for shortness of breath.   Cardiovascular: Negative for chest pain and palpitations.  Gastrointestinal: Positive for nausea, vomiting and abdominal pain. Negative for diarrhea, constipation, blood in stool and anal bleeding.  All other systems reviewed and are negative.    Allergies  Review of patient's allergies indicates no known allergies.  Home Medications   Current Outpatient Rx  Name Route  Sig Dispense Refill  . ACETAMINOPHEN-CODEINE #3 300-30 MG PO TABS Oral Take 1 tablet by mouth every 4 (four) hours as needed. For pain    . OMEPRAZOLE 20 MG PO CPDR Oral Take 20 mg by mouth daily.      BP 147/82  Pulse 55  Temp 98.7 F (37.1 C) (Oral)  Resp 18  SpO2 98%  Physical Exam  Nursing note and vitals reviewed. Constitutional: He appears well-developed and well-nourished. He appears distressed.  HENT:  Head: Normocephalic and atraumatic.  Mouth/Throat: Oropharynx is clear and moist.  Eyes: Conjunctivae and EOM are normal. No scleral icterus.  Neck: Normal range of motion. Neck supple.  Cardiovascular: Normal rate, regular rhythm and normal heart sounds.   Pulmonary/Chest: Effort normal and breath sounds normal.  Abdominal: Soft. Bowel sounds are normal. There is tenderness.       Tenderness and guarding to palpation of the epigastrium.   Neurological: He is alert.  Skin: Skin is warm and dry.    ED Course  Procedures (including critical care time)  Labs Reviewed  CBC WITH DIFFERENTIAL - Abnormal; Notable for the following:    Hemoglobin 17.3 (*)     All other components within normal limits  COMPREHENSIVE METABOLIC PANEL - Abnormal; Notable for the following:    Glucose, Bld 110 (*)     GFR calc non Af Amer 75 (*)     GFR calc Af Amer 87 (*)     All other components within normal limits  URINALYSIS, ROUTINE W REFLEX MICROSCOPIC  LIPASE, BLOOD   Results for orders placed during the hospital encounter of 06/07/12  CBC  WITH DIFFERENTIAL      Component Value Range   WBC 9.9  4.0 - 10.5 K/uL   RBC 5.14  4.22 - 5.81 MIL/uL   Hemoglobin 17.3 (*) 13.0 - 17.0 g/dL   HCT 24.4  01.0 - 27.2 %   MCV 94.9  78.0 - 100.0 fL   MCH 33.7  26.0 - 34.0 pg   MCHC 35.5  30.0 - 36.0 g/dL   RDW 53.6  64.4 - 03.4 %   Platelets 208  150 - 400 K/uL   Neutrophils Relative 63  43 - 77 %   Neutro Abs 6.2  1.7 - 7.7 K/uL   Lymphocytes Relative 28  12 - 46 %   Lymphs Abs 2.7  0.7 -  4.0 K/uL   Monocytes Relative 9  3 - 12 %   Monocytes Absolute 0.9  0.1 - 1.0 K/uL   Eosinophils Relative 0  0 - 5 %   Eosinophils Absolute 0.0  0.0 - 0.7 K/uL   Basophils Relative 0  0 - 1 %   Basophils Absolute 0.0  0.0 - 0.1 K/uL  COMPREHENSIVE METABOLIC PANEL      Component Value Range   Sodium 141  135 - 145 mEq/L   Potassium 3.8  3.5 - 5.1 mEq/L   Chloride 102  96 - 112 mEq/L   CO2 29  19 - 32 mEq/L   Glucose, Bld 110 (*) 70 - 99 mg/dL   BUN 13  6 - 23 mg/dL   Creatinine, Ser 7.42  0.50 - 1.35 mg/dL   Calcium 59.5  8.4 - 63.8 mg/dL   Total Protein 8.0  6.0 - 8.3 g/dL   Albumin 4.4  3.5 - 5.2 g/dL   AST 14  0 - 37 U/L   ALT 9  0 - 53 U/L   Alkaline Phosphatase 68  39 - 117 U/L   Total Bilirubin 0.5  0.3 - 1.2 mg/dL   GFR calc non Af Amer 75 (*) >90 mL/min   GFR calc Af Amer 87 (*) >90 mL/min  URINALYSIS, ROUTINE W REFLEX MICROSCOPIC      Component Value Range   Color, Urine AMBER (*) YELLOW   APPearance CLOUDY (*) CLEAR   Specific Gravity, Urine 1.029  1.005 - 1.030   pH 6.0  5.0 - 8.0   Glucose, UA NEGATIVE  NEGATIVE mg/dL   Hgb urine dipstick SMALL (*) NEGATIVE   Bilirubin Urine SMALL (*) NEGATIVE   Ketones, ur 40 (*) NEGATIVE mg/dL   Protein, ur 30 (*) NEGATIVE mg/dL   Urobilinogen, UA 1.0  0.0 - 1.0 mg/dL   Nitrite NEGATIVE  NEGATIVE   Leukocytes, UA SMALL (*) NEGATIVE  LIPASE, BLOOD      Component Value Range   Lipase 36  11 - 59 U/L  URINE MICROSCOPIC-ADD ON      Component Value Range   Squamous Epithelial / LPF RARE  RARE   WBC, UA 7-10  <3 WBC/hpf   RBC / HPF 7-10  <3 RBC/hpf   Bacteria, UA RARE  RARE   Urine-Other MUCOUS PRESENT      No results found.   1. Abdominal pain   2. GERD (gastroesophageal reflux disease)       MDM  Patient presented with epigastric abdominal pain rated 10/10. Patient given pain and antinausea medication in ED. Patient assessed and no longer tender to palpation, in pain, or nauseous. CBC,CMP, Lipase: unremarkable.  Patient discharged on omeprazole with sufficient refills.  No red flags for PUD or pancreatitis.         Pixie Casino, PA-C 06/07/12 2312

## 2012-06-07 NOTE — ED Notes (Signed)
Per pt sts abdominal pain since last night associated with nausea, vomiting. sts pain in epigastric area. sts last BM a few days ago.

## 2012-06-08 NOTE — ED Provider Notes (Signed)
Medical screening examination/treatment/procedure(s) were performed by non-physician practitioner and as supervising physician I was immediately available for consultation/collaboration.   Gwyneth Sprout, MD 06/08/12 2212

## 2012-08-18 ENCOUNTER — Encounter (HOSPITAL_COMMUNITY): Payer: Self-pay | Admitting: Emergency Medicine

## 2012-08-18 ENCOUNTER — Emergency Department (INDEPENDENT_AMBULATORY_CARE_PROVIDER_SITE_OTHER)
Admission: EM | Admit: 2012-08-18 | Discharge: 2012-08-18 | Disposition: A | Discharge status: Home / Self Care | Payer: 59 | Source: Home / Self Care | Attending: Family Medicine | Admitting: Family Medicine

## 2012-08-18 DIAGNOSIS — J069 Acute upper respiratory infection, unspecified: Secondary | ICD-10-CM

## 2012-08-18 HISTORY — DX: Gastro-esophageal reflux disease without esophagitis: K21.9

## 2012-08-18 LAB — POCT RAPID STREP A: Streptococcus, Group A Screen (Direct): NEGATIVE

## 2012-08-18 MED ORDER — IBUPROFEN 600 MG PO TABS: 600.0000 mg | ORAL_TABLET | Freq: Three times a day (TID) | ORAL | Status: DC

## 2012-08-18 MED ORDER — CETIRIZINE-PSEUDOEPHEDRINE ER 5-120 MG PO TB12: 1.0000 | ORAL_TABLET | Freq: Two times a day (BID) | ORAL | Status: DC

## 2012-08-18 NOTE — ED Notes (Signed)
Pt c/o sore throat x4 days... Denies: fevers, vomiting, nauseas, diarrhea, cough, congestion... Pt is alert w/no signs of distress.

## 2012-08-18 NOTE — ED Provider Notes (Signed)
History     CSN: [id number]  Arrival date & time 08/18/12  1050   First MD Initiated Contact with Patient 08/18/12 1100      Chief Complaint  Patient presents with  . Sore Throat    (Consider location/radiation/quality/duration/timing/severity/associated sxs/prior treatment) HPI Comments: 46 year old smoker male with no significant past medical history. Here complaining of sore throat for 3 days. Denies fever or chills. No abdominal pain nausea vomiting or diarrhea. No headache. No nasal congestion or cough. No rash. No shortness of breath or chest pain. 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 Tolerating solids and fluids. Patient is not allergic to any medications. Has taken sore throat lozenges without significant relief.    Past Medical History  Diagnosis Date  . Acid reflux     History reviewed. No pertinent past surgical history.  No family history on file.  History  Substance Use Topics  . Smoking status: Current Every Day Smoker -- 0.5 packs/day    Types: Cigarettes  . Smokeless tobacco: Not on file  . Alcohol Use: No      Review of Systems  Constitutional: Negative for fever, chills, diaphoresis, activity change, appetite change and fatigue.  HENT: Positive for sore throat. Negative for ear pain, congestion and rhinorrhea.   Eyes: Negative for discharge.  Respiratory: Negative for cough and shortness of breath.   Cardiovascular: Negative for chest pain.  Gastrointestinal: Negative for nausea, vomiting, abdominal pain and diarrhea.  Skin: Negative for rash.  Neurological: Negative for dizziness and headaches.  All other systems reviewed and are negative.    Allergies  Review of patient's allergies indicates no known allergies.  Home Medications   Current Outpatient Rx  Name  Route  Sig  Dispense  Refill  . OMEPRAZOLE 20 MG PO CPDR   Oral   Take 1 capsule (20 mg total) by mouth daily.   30 capsule   3   . ACETAMINOPHEN-CODEINE #3  300-30 MG PO TABS   Oral   Take 1 tablet by mouth every 4 (four) hours as needed. For pain         . CETIRIZINE-PSEUDOEPHEDRINE ER 5-120 MG PO TB12   Oral   Take 1 tablet by mouth 2 (two) times daily.   30 tablet   0   . IBUPROFEN 600 MG PO TABS   Oral   Take 1 tablet (600 mg total) by mouth 3 (three) times daily.   30 tablet   0   . OMEPRAZOLE 20 MG PO CPDR   Oral   Take 20 mg by mouth daily.           BP 123/79  Pulse 69  Temp 98.3 F (36.8 C) (Oral)  Resp 20  SpO2 100%  Physical Exam  Nursing note and vitals reviewed. Constitutional: He is oriented to person, place, and time. He appears well-developed and well-nourished. No distress.  HENT:  Head: Normocephalic.       Nasal Congestion with erythema and swelling of nasal turbinates, clear rhinorrhea. Significant pharyngeal erythema no exudates. No uvula deviation. No trismus. TM's normal   Eyes: Conjunctivae normal are normal. Right eye exhibits no discharge. Left eye exhibits no discharge.  Cardiovascular: Normal rate, regular rhythm and normal heart sounds.   Pulmonary/Chest: Effort normal and breath sounds normal.  Abdominal: Soft.       No hepatosplenomegaly  Lymphadenopathy:    He has no cervical adenopathy.  Neurological: He is alert and oriented to person, place, and  time.  Skin: No rash noted.    ED Course  Procedures (including critical care time)   Labs Reviewed  POCT RAPID STREP A ([facility] CARE ONLY)  LAB REPORT - SCANNED   No results found.   1. URI (upper respiratory infection)       MDM  Negative rapid strep test. Clinically well. Treated symptomatically. Prescribed ibuprofen and Zyrtec-D. Supportive care and red flags that should prompt his return to medical attention discussed with patient and provided in writing.        Sharin Grave, MD 08/20/12 [phone]

## 2012-10-08 ENCOUNTER — Emergency Department (HOSPITAL_COMMUNITY)
Admission: EM | Admit: 2012-10-08 | Discharge: 2012-10-08 | Disposition: A | Discharge status: Home / Self Care | Payer: Self-pay | Attending: Emergency Medicine | Admitting: Emergency Medicine

## 2012-10-08 ENCOUNTER — Encounter (HOSPITAL_COMMUNITY): Payer: Self-pay

## 2012-10-08 DIAGNOSIS — E860 Dehydration: Secondary | ICD-10-CM | POA: Insufficient documentation

## 2012-10-08 DIAGNOSIS — Z79899 Other long term (current) drug therapy: Secondary | ICD-10-CM | POA: Insufficient documentation

## 2012-10-08 DIAGNOSIS — K5289 Other specified noninfective gastroenteritis and colitis: Secondary | ICD-10-CM | POA: Insufficient documentation

## 2012-10-08 DIAGNOSIS — F17200 Nicotine dependence, unspecified, uncomplicated: Secondary | ICD-10-CM | POA: Insufficient documentation

## 2012-10-08 DIAGNOSIS — R509 Fever, unspecified: Secondary | ICD-10-CM | POA: Insufficient documentation

## 2012-10-08 DIAGNOSIS — K219 Gastro-esophageal reflux disease without esophagitis: Secondary | ICD-10-CM | POA: Insufficient documentation

## 2012-10-08 DIAGNOSIS — K529 Noninfective gastroenteritis and colitis, unspecified: Secondary | ICD-10-CM

## 2012-10-08 DIAGNOSIS — Z7982 Long term (current) use of aspirin: Secondary | ICD-10-CM | POA: Insufficient documentation

## 2012-10-08 LAB — URINALYSIS, MICROSCOPIC ONLY
Glucose, UA: NEGATIVE mg/dL
Hgb urine dipstick: NEGATIVE
Ketones, ur: >80 mg/dL — AB
Nitrite: NEGATIVE
Protein, ur: 30 mg/dL — AB
Specific Gravity, Urine: 1.036 — ABNORMAL HIGH (ref 1.005–1.030)
Urobilinogen, UA: 1 mg/dL (ref 0.0–1.0)
pH: 6 (ref 5.0–8.0)

## 2012-10-08 LAB — POCT I-STAT TROPONIN I: Troponin i, poc: 0 ng/mL (ref 0.00–0.08)

## 2012-10-08 LAB — CBC WITH DIFFERENTIAL/PLATELET
Basophils Absolute: 0 10*3/uL (ref 0.0–0.1)
Basophils Relative: 0 % (ref 0–1)
Eosinophils Absolute: 0 K/uL (ref 0.0–0.7)
Eosinophils Relative: 0 % (ref 0–5)
HCT: 46.4 % (ref 39.0–52.0)
Hemoglobin: 16.3 g/dL (ref 13.0–17.0)
Lymphocytes Relative: 22 % (ref 12–46)
Lymphs Abs: 2 10*3/uL (ref 0.7–4.0)
MCH: 33.3 pg (ref 26.0–34.0)
MCHC: 35.1 g/dL (ref 30.0–36.0)
MCV: 94.9 fL (ref 78.0–100.0)
Monocytes Absolute: 0.7 K/uL (ref 0.1–1.0)
Monocytes Relative: 8 % (ref 3–12)
Neutro Abs: 6.4 10*3/uL (ref 1.7–7.7)
Neutrophils Relative %: 70 % (ref 43–77)
Platelets: 191 10*3/uL (ref 150–400)
RBC: 4.89 MIL/uL (ref 4.22–5.81)
RDW: 12.1 % (ref 11.5–15.5)
WBC: 9.1 10*3/uL (ref 4.0–10.5)

## 2012-10-08 LAB — COMPREHENSIVE METABOLIC PANEL
ALT: 8 U/L (ref 0–53)
AST: 12 U/L (ref 0–37)
Alkaline Phosphatase: 69 U/L (ref 39–117)
CO2: 26 mEq/L (ref 19–32)
Chloride: 100 mEq/L (ref 96–112)
GFR calc non Af Amer: >90 mL/min (ref >90)
Glucose, Bld: 100 mg/dL — ABNORMAL HIGH (ref 70–99)
Sodium: 141 mEq/L (ref 135–145)
Total Bilirubin: 0.6 mg/dL (ref 0.3–1.2)

## 2012-10-08 LAB — COMPREHENSIVE METABOLIC PANEL WITH GFR
Albumin: 4.3 g/dL (ref 3.5–5.2)
BUN: 11 mg/dL (ref 6–23)
Calcium: 10 mg/dL (ref 8.4–10.5)
Creatinine, Ser: 0.95 mg/dL (ref 0.50–1.35)
GFR calc Af Amer: >90 mL/min (ref >90)
Potassium: 3.4 meq/L — ABNORMAL LOW (ref 3.5–5.1)
Total Protein: 7.9 g/dL (ref 6.0–8.3)

## 2012-10-08 LAB — LIPASE, BLOOD: Lipase: 29 U/L (ref 11–59)

## 2012-10-08 LAB — CG4 I-STAT (LACTIC ACID): Lactic Acid, Venous: 1.57 mmol/L (ref 0.5–2.2)

## 2012-10-08 MED ORDER — GI COCKTAIL ~~LOC~~
30.0000 mL | Freq: Once | ORAL | Status: AC
  Administered 2012-10-08: 30 mL via ORAL
  Filled 2012-10-08: qty 30

## 2012-10-08 MED ORDER — SODIUM CHLORIDE 0.9 % IV SOLN: 1000.0000 mL | INTRAVENOUS | Status: DC

## 2012-10-08 MED ORDER — OXYCODONE-ACETAMINOPHEN 5-325 MG PO TABS
1.0000 | ORAL_TABLET | Freq: Once | ORAL | Status: AC
  Administered 2012-10-08: 1 via ORAL
  Filled 2012-10-08: qty 1

## 2012-10-08 MED ORDER — SODIUM CHLORIDE 0.9 % IV SOLN
1000.0000 mL | Freq: Once | INTRAVENOUS | Status: AC
  Administered 2012-10-08: 1000 mL via INTRAVENOUS

## 2012-10-08 MED ORDER — ONDANSETRON HCL 4 MG/2ML IJ SOLN
4.0000 mg | Freq: Once | INTRAMUSCULAR | Status: AC
  Administered 2012-10-08: 4 mg via INTRAVENOUS
  Filled 2012-10-08: qty 2

## 2012-10-08 MED ORDER — OXYCODONE-ACETAMINOPHEN 5-325 MG PO TABS: 2.0000 | ORAL_TABLET | ORAL | Status: DC | PRN

## 2012-10-08 MED ORDER — HYDROMORPHONE HCL PF 1 MG/ML IJ SOLN
1.0000 mg | Freq: Once | INTRAMUSCULAR | Status: AC
  Administered 2012-10-08: 1 mg via INTRAVENOUS
  Filled 2012-10-08: qty 1

## 2012-10-08 MED ORDER — ONDANSETRON HCL 4 MG PO TABS: 4.0000 mg | ORAL_TABLET | Freq: Four times a day (QID) | ORAL | Status: DC

## 2012-10-08 NOTE — ED Provider Notes (Signed)
4:36 PM Handoff from [place] PA-C. Work-up complete and patient is feeling better. Awaiting PO trial. Plan to send home is stable. Patient to follow-up with PCP and GI.   5:24 PM Exam:  Gen NAD; Heart RRR, nml S1,S2, no m/r/g; Lungs CTAB; Abd soft, mild epigastric tenderness to palp, no rebound or guarding; Ext 2+ pedal pulses bilaterally, no edema.  5:24 PM Patient has walked without problems. Pain remains controlled. Mild epigastric pain on re-exam. Patient agreeable to discharge.   The patient was urged to return to the Emergency Department immediately with worsening of current symptoms, worsening abdominal pain, persistent vomiting, blood noted in stools, fever, or any other concerns. The patient verbalized understanding.     Renne Crigler, [place] 10/08/12 1725

## 2012-10-08 NOTE — ED Provider Notes (Signed)
Medical screening examination/treatment/procedure(s) were performed by non-physician practitioner and as supervising physician I was immediately available for consultation/collaboration.   Gavin Pound. Amiir Heckard, MD 10/08/12 4782

## 2012-10-08 NOTE — ED Notes (Signed)
Pt was walked in hallway and tolerated well.

## 2012-10-08 NOTE — ED Provider Notes (Signed)
History     CSN: [id number]  Arrival date & time 10/08/12  9147   First MD Initiated Contact with Patient 10/08/12 1101      Chief Complaint  Patient presents with  . Weakness    (Consider location/radiation/quality/duration/timing/severity/associated sxs/prior treatment) The history is provided by the patient, the spouse and medical records.    Todd Allen is a 46 y.o. male  with a hx of GERD presents to the Emergency Department complaining of gradual, persistent, progressively worsening weakness onset 3 days ago. Patient states 3 days ago he began having upper abdominal pain which was accompanied by nausea vomiting and diarrhea. He states that he has been unable to control the nausea vomiting and diarrhea and has progressively weaker over the last few days.   He has also had a fever up to 102 she is to Tylenol and ibuprofen.   Wife states she had the same thing last week but did not get this set. Associated symptoms include fatigue, fever, abdominal pain, nausea, vomiting, diarrhea.  Nothing makes it better and  nothing makes it worse.  Pt denies chest pain, shortness of breath, rash, syncope, nasal congestion.     Past Medical History  Diagnosis Date  . Acid reflux     History reviewed. No pertinent past surgical history.  History reviewed. No pertinent family history.  History  Substance Use Topics  . Smoking status: Current Every Day Smoker -- 0.5 packs/day    Types: Cigarettes  . Smokeless tobacco: Not on file  . Alcohol Use: No      Review of Systems  Constitutional: Positive for fever, chills and fatigue. Negative for diaphoresis, appetite change and unexpected weight change.  HENT: Negative for congestion, rhinorrhea, drooling, mouth sores, neck stiffness, voice change and sinus pressure.   Eyes: Negative for visual disturbance.  Respiratory: Negative for cough, chest tightness, shortness of breath and wheezing.   Cardiovascular: Negative for chest pain.    Gastrointestinal: Positive for nausea, vomiting, abdominal pain and diarrhea. Negative for constipation.  Genitourinary: Negative for dysuria, urgency, frequency and hematuria.  Musculoskeletal: Negative for back pain.  Skin: Negative for pallor and rash.  Neurological: Positive for weakness. Negative for syncope, light-headedness and headaches.  Psychiatric/Behavioral: Negative for sleep disturbance. The patient is not nervous/anxious.   All other systems reviewed and are negative.    Allergies  Review of patient's allergies indicates no known allergies.  Home Medications   Current Outpatient Rx  Name  Route  Sig  Dispense  Refill  . OMEPRAZOLE 20 MG PO CPDR   Oral   Take 20 mg by mouth daily.         Marland Kitchen OVER THE COUNTER MEDICATION   Oral   Take 2 tablets by mouth daily as needed. For pain  Aspirin Back & Body Pain           BP 150/92  Pulse 95  Temp 98.6 F (37 C) (Oral)  Resp 21  SpO2 100%  Physical Exam  Nursing note and vitals reviewed. Constitutional: He is oriented to person, place, and time. He appears well-developed and well-nourished. No distress.       Pt very weak, sleepy but not lethargic. He responds to voice, orients and answers questions appropriately  HENT:  Head: Normocephalic and atraumatic.  Right Ear: Tympanic membrane, external ear and ear canal normal.  Left Ear: Tympanic membrane, external ear and ear canal normal.  Nose: Nose normal.  Mouth/Throat: Uvula is midline and oropharynx is  clear and moist. Mucous membranes are dry. No oropharyngeal exudate.  Eyes: Conjunctivae normal and EOM are normal. Pupils are equal, round, and reactive to light. No scleral icterus.  Neck: Normal range of motion. Neck supple.  Cardiovascular: Normal rate, regular rhythm, normal heart sounds and intact distal pulses.  Exam reveals no gallop and no friction rub.   No murmur heard. Pulmonary/Chest: Effort normal and breath sounds normal. No respiratory  distress. He has no wheezes. He has no rales. He exhibits no tenderness.  Abdominal: Soft. Normal appearance and bowel sounds are normal. He exhibits no mass. There is tenderness in the epigastric area. There is guarding. There is no rigidity, no rebound, no CVA tenderness, no tenderness at McBurney's point and negative Murphy's sign.  Musculoskeletal: Normal range of motion. He exhibits no edema and no tenderness.  Lymphadenopathy:    He has no cervical adenopathy.  Neurological: He is alert and oriented to person, place, and time. He exhibits normal muscle tone. Coordination normal.       Speech is clear and goal oriented Moves extremities without ataxia  Skin: Skin is warm and dry. No rash noted. He is not diaphoretic. No erythema.  Psychiatric: He has a normal mood and affect.    ED Course  Procedures (including critical care time)  Labs Reviewed  COMPREHENSIVE METABOLIC PANEL - Abnormal; Notable for the following:    Potassium 3.4 (*)     Glucose, Bld 100 (*)     All other components within normal limits  URINALYSIS, MICROSCOPIC ONLY - Abnormal; Notable for the following:    Color, Urine AMBER (*)  BIOCHEMICALS MAY BE AFFECTED BY COLOR   APPearance CLOUDY (*)     Specific Gravity, Urine 1.036 (*)     Bilirubin Urine SMALL (*)     Ketones, ur >80 (*)     Protein, ur 30 (*)     Leukocytes, UA TRACE (*)     Squamous Epithelial / LPF FEW (*)     All other components within normal limits  CBC WITH DIFFERENTIAL  LIPASE, BLOOD  POCT I-STAT TROPONIN I  CG4 I-STAT (LACTIC ACID)   No results found.   1. Gastroenteritis   2. Dehydration   3. Fever       MDM  MAXINE HUYNH presents with generalized weakness after days of N/V/D.  patient given 3 L of fluid along with a GI cocktail.  Patient more alert, mucous membranes more moist.  Patient states he feels much better, stronger and his abdominal pain has resolved with a GI cocktail.  Patient without elevated lactic acid, CMP  with mild hypokalemia at 3.4, lipase within normal limits, white blood cell count 9.1, CBC unremarkable, urinalysis without evidence of urinary tract infection but with evidence of dehydration including ketones.  Low suspicion for infectious process causing symptoms rather more likely viral gastroenteritis.  Will give patient by mouth trial and ambulate in the hall to ensure that he is strong enough for discharge home.  I have discussed this with the patient, it is his wish to be discharged home if possible.  He tolerates by mouth liquids greater than 6 ounces.  He now complains of increasing abdominal pain.  Record review of multiple CTs and ultrasounds in the past 5 years all negative.  Will treat his pain and discharge.  Pt with resolution of pain again.  I have discussed the patient with Dr Oletta Lamas and he is in agreement with the plan. I have discussed the  patient with Renne Crigler PA-C who will assume care.  Plan: Pt will ambulate in the hall and then will discharge home.     1. Medications: usual home medications 2. Treatment: rest, drink plenty of fluids 3. Follow Up: Please followup with your primary doctor for discussion of your diagnoses and further evaluation after today's visit; if you do not have a primary care doctor use the resource guide provided to find one  Dierdre Forth, PA-C 10/08/12 1644

## 2012-10-08 NOTE — ED Notes (Signed)
Pt reports upper abd pain, N/V/D "for a while". Pt presents lethargic but arousal to verbal stimuli

## 2012-10-08 NOTE — ED Provider Notes (Signed)
Medical screening examination/treatment/procedure(s) were performed by non-physician practitioner and as supervising physician I was immediately available for consultation/collaboration.  Messiyah Waterson T Lenae Wherley, MD 10/08/12 2215 

## 2012-10-09 ENCOUNTER — Emergency Department (HOSPITAL_COMMUNITY): Payer: Self-pay

## 2012-10-09 ENCOUNTER — Emergency Department (HOSPITAL_COMMUNITY)
Admission: EM | Admit: 2012-10-09 | Discharge: 2012-10-09 | Disposition: A | Discharge status: Home / Self Care | Payer: Self-pay | Attending: Emergency Medicine | Admitting: Emergency Medicine

## 2012-10-09 ENCOUNTER — Encounter (HOSPITAL_COMMUNITY): Payer: Self-pay | Admitting: Cardiology

## 2012-10-09 DIAGNOSIS — R079 Chest pain, unspecified: Secondary | ICD-10-CM | POA: Insufficient documentation

## 2012-10-09 DIAGNOSIS — F17200 Nicotine dependence, unspecified, uncomplicated: Secondary | ICD-10-CM | POA: Insufficient documentation

## 2012-10-09 DIAGNOSIS — Z79899 Other long term (current) drug therapy: Secondary | ICD-10-CM | POA: Insufficient documentation

## 2012-10-09 DIAGNOSIS — I10 Essential (primary) hypertension: Secondary | ICD-10-CM | POA: Insufficient documentation

## 2012-10-09 DIAGNOSIS — K219 Gastro-esophageal reflux disease without esophagitis: Secondary | ICD-10-CM | POA: Insufficient documentation

## 2012-10-09 DIAGNOSIS — R0602 Shortness of breath: Secondary | ICD-10-CM | POA: Insufficient documentation

## 2012-10-09 DIAGNOSIS — K5289 Other specified noninfective gastroenteritis and colitis: Secondary | ICD-10-CM | POA: Insufficient documentation

## 2012-10-09 DIAGNOSIS — K529 Noninfective gastroenteritis and colitis, unspecified: Secondary | ICD-10-CM

## 2012-10-09 HISTORY — DX: Essential (primary) hypertension: I10

## 2012-10-09 LAB — COMPREHENSIVE METABOLIC PANEL
ALT: 7 U/L (ref 0–53)
Alkaline Phosphatase: 54 U/L (ref 39–117)
BUN: 31 mg/dL — ABNORMAL HIGH (ref 6–23)
CO2: 21 mEq/L (ref 19–32)
Calcium: 9.3 mg/dL (ref 8.4–10.5)
GFR calc Af Amer: >90 mL/min (ref >90)
GFR calc non Af Amer: >90 mL/min (ref >90)
Glucose, Bld: 106 mg/dL — ABNORMAL HIGH (ref 70–99)
Potassium: 4 mEq/L (ref 3.5–5.1)
Sodium: 135 mEq/L (ref 135–145)
Total Protein: 7 g/dL (ref 6.0–8.3)

## 2012-10-09 LAB — RAPID URINE DRUG SCREEN, HOSP PERFORMED
Amphetamines: NOT DETECTED
Cocaine: NOT DETECTED
Opiates: NOT DETECTED
Tetrahydrocannabinol: POSITIVE — AB

## 2012-10-09 LAB — CBC
HCT: 42.1 % (ref 39.0–52.0)
Hemoglobin: 15.1 g/dL (ref 13.0–17.0)
MCH: 33.7 pg (ref 26.0–34.0)
MCHC: 35.9 g/dL (ref 30.0–36.0)
RBC: 4.48 MIL/uL (ref 4.22–5.81)

## 2012-10-09 LAB — LIPASE, BLOOD: Lipase: 42 U/L (ref 11–59)

## 2012-10-09 LAB — URINALYSIS, ROUTINE W REFLEX MICROSCOPIC
Bilirubin Urine: NEGATIVE
Glucose, UA: NEGATIVE mg/dL
Ketones, ur: >80 mg/dL — AB
pH: 6.5 (ref 5.0–8.0)

## 2012-10-09 LAB — URINE MICROSCOPIC-ADD ON

## 2012-10-09 MED ORDER — KETOROLAC TROMETHAMINE 30 MG/ML IJ SOLN
30.0000 mg | Freq: Once | INTRAMUSCULAR | Status: AC
  Administered 2012-10-09: 30 mg via INTRAVENOUS
  Filled 2012-10-09: qty 1

## 2012-10-09 MED ORDER — IOHEXOL 300 MG/ML  SOLN
80.0000 mL | Freq: Once | INTRAMUSCULAR | Status: AC | PRN
  Administered 2012-10-09: 80 mL via INTRAVENOUS

## 2012-10-09 MED ORDER — MORPHINE SULFATE 4 MG/ML IJ SOLN
4.0000 mg | Freq: Once | INTRAMUSCULAR | Status: AC
  Administered 2012-10-09: 4 mg via INTRAVENOUS
  Filled 2012-10-09: qty 1

## 2012-10-09 MED ORDER — SUCRALFATE 1 G PO TABS
1.0000 g | ORAL_TABLET | Freq: Three times a day (TID) | ORAL | Status: DC
  Administered 2012-10-09: 1 g via ORAL
  Filled 2012-10-09 (×3): qty 1

## 2012-10-09 MED ORDER — ONDANSETRON HCL 4 MG/2ML IJ SOLN
4.0000 mg | Freq: Once | INTRAMUSCULAR | Status: AC
  Administered 2012-10-09: 4 mg via INTRAVENOUS
  Filled 2012-10-09: qty 2

## 2012-10-09 MED ORDER — SODIUM CHLORIDE 0.9 % IV SOLN
INTRAVENOUS | Status: DC
  Administered 2012-10-09: 14:00:00 via INTRAVENOUS

## 2012-10-09 MED ORDER — SODIUM CHLORIDE 0.9 % IV BOLUS (SEPSIS)
1000.0000 mL | Freq: Once | INTRAVENOUS | Status: AC
  Administered 2012-10-09: 1000 mL via INTRAVENOUS

## 2012-10-09 MED ORDER — PANTOPRAZOLE SODIUM 40 MG IV SOLR
40.0000 mg | Freq: Once | INTRAVENOUS | Status: AC
  Administered 2012-10-09: 40 mg via INTRAVENOUS
  Filled 2012-10-09: qty 40

## 2012-10-09 NOTE — ED Provider Notes (Addendum)
History     CSN: [id number]  Arrival date & time 10/09/12  1203   First MD Initiated Contact with Patient 10/09/12 1240      Chief Complaint  Patient presents with  . Chest Pain  . Abdominal Pain  . Shortness of Breath    (Consider location/radiation/quality/duration/timing/severity/associated sxs/prior treatment) The history is provided by the patient.   Patient here complaining of diffuse abdominal pain radiating to his chest with some associated shortness of breath. Was seen yesterday for similar symptoms and diagnosed with gastroenteritis. Denies any anginal type chest pain. Does note some myalgias and low-grade temperature at home. Also complains of feeling weak and is worse with walking. Some watery diarrhea as well 2. Abdominal pain is been colicky and without urinary symptoms. Has been using medications at home without relief Past Medical History  Diagnosis Date  . Acid reflux   . Hypertension     History reviewed. No pertinent past surgical history.  History reviewed. No pertinent family history.  History  Substance Use Topics  . Smoking status: Current Every Day Smoker -- 0.5 packs/day    Types: Cigarettes  . Smokeless tobacco: Not on file  . Alcohol Use: No      Review of Systems  All other systems reviewed and are negative.    Allergies  Review of patient's allergies indicates no known allergies.  Home Medications   Current Outpatient Rx  Name  Route  Sig  Dispense  Refill  . OMEPRAZOLE 20 MG PO CPDR   Oral   Take 20 mg by mouth daily.         Marland Kitchen OVER THE COUNTER MEDICATION   Oral   Take 2 tablets by mouth daily as needed. For pain  Aspirin Back & Body Pain         . ONDANSETRON HCL 4 MG PO TABS   Oral   Take 1 tablet (4 mg total) by mouth every 6 (six) hours.   12 tablet   0   . OXYCODONE-ACETAMINOPHEN 5-325 MG PO TABS   Oral   Take 2 tablets by mouth every 4 (four) hours as needed for pain.   12 tablet   0     BP 143/91   Pulse 131  Temp 98.6 F (37 C) (Oral)  Resp 14  SpO2 99%  Physical Exam  Nursing note and vitals reviewed. Constitutional: He is oriented to person, place, and time. He appears well-developed and well-nourished.  Non-toxic appearance. No distress.  HENT:  Head: Normocephalic and atraumatic.  Eyes: Conjunctivae normal, EOM and lids are normal. Pupils are equal, round, and reactive to light.  Neck: Normal range of motion. Neck supple. No tracheal deviation present. No mass present.  Cardiovascular: Regular rhythm and normal heart sounds.  Tachycardia present.  Exam reveals no gallop.   No murmur heard. Pulmonary/Chest: Effort normal and breath sounds normal. No stridor. No respiratory distress. He has no decreased breath sounds. He has no wheezes. He has no rhonchi. He has no rales.  Abdominal: Soft. Normal appearance and bowel sounds are normal. He exhibits no distension. There is generalized tenderness. There is no rebound and no CVA tenderness.  Musculoskeletal: Normal range of motion. He exhibits no edema and no tenderness.  Neurological: He is alert and oriented to person, place, and time. He has normal strength. No cranial nerve deficit or sensory deficit. GCS eye subscore is 4. GCS verbal subscore is 5. GCS motor subscore is 6.  Skin: Skin is warm  and dry. No abrasion and no rash noted.  Psychiatric: He has a normal mood and affect. His speech is normal and behavior is normal.    ED Course  Procedures (including critical care time)  Labs Reviewed  CBC - Abnormal; Notable for the following:    WBC 12.4 (*)     All other components within normal limits  COMPREHENSIVE METABOLIC PANEL - Abnormal; Notable for the following:    Glucose, Bld 106 (*)     BUN 31 (*)     All other components within normal limits  URINE RAPID DRUG SCREEN (HOSP PERFORMED) - Abnormal; Notable for the following:    Tetrahydrocannabinol POSITIVE (*)     Barbiturates POSITIVE (*)     All other components  within normal limits  LIPASE, BLOOD  POCT I-STAT TROPONIN I  URINALYSIS, ROUTINE W REFLEX MICROSCOPIC  URINE CULTURE  INFLUENZA PANEL BY PCR   Dg Chest Port 1 View  10/09/2012  *RADIOLOGY REPORT*  Clinical Data: Chest pain and shortness of breath.  PORTABLE CHEST - 1 VIEW  Comparison: 08/17/2005  Findings: 1308 hours. The lungs are clear without focal consolidation, edema, effusion or pneumothorax.  Cardiopericardial silhouette is within normal limits for size.  Imaged bony structures of the thorax are intact. Telemetry leads overlie the chest.  IMPRESSION: Normal exam.   Original Report Authenticated By: Kennith Center, M.D.      No diagnosis found.    MDM  Patient given IV fluids here for his likely dehydration. Does note abdominal cramping. Patient given pain medication and does flow better but continues to note chills and fever. Will obtain CT of his abdomen. Influenza test is pending. We'll also check urinalysis as his chest x-ray was negative. We'll send to CDU      Toy Baker, MD 10/09/12 1517  Toy Baker, MD 10/27/12 [phone]

## 2012-10-09 NOTE — ED Notes (Signed)
Patient transported to CT 

## 2012-10-09 NOTE — ED Notes (Signed)
NAD noted at time of d/c home 

## 2012-10-09 NOTE — ED Notes (Signed)
Pt reports chest, abd pain and SOB. Reports the pain started on Friday. Also reports some weakness, and fever. Reports increased weakness starting this morning.

## 2012-10-10 LAB — URINE CULTURE
Colony Count: NO GROWTH
Culture: NO GROWTH

## 2012-10-10 LAB — INFLUENZA PANEL BY PCR (TYPE A & B): Influenza A By PCR: NEGATIVE

## 2012-10-31 ENCOUNTER — Emergency Department (HOSPITAL_COMMUNITY)
Admission: EM | Admit: 2012-10-31 | Discharge: 2012-10-31 | Disposition: A | Discharge status: Home / Self Care | Payer: Self-pay | Attending: Emergency Medicine | Admitting: Emergency Medicine

## 2012-10-31 ENCOUNTER — Encounter (HOSPITAL_COMMUNITY): Payer: Self-pay | Admitting: *Deleted

## 2012-10-31 ENCOUNTER — Emergency Department (HOSPITAL_COMMUNITY): Payer: Self-pay

## 2012-10-31 DIAGNOSIS — R109 Unspecified abdominal pain: Secondary | ICD-10-CM | POA: Insufficient documentation

## 2012-10-31 DIAGNOSIS — K219 Gastro-esophageal reflux disease without esophagitis: Secondary | ICD-10-CM | POA: Insufficient documentation

## 2012-10-31 DIAGNOSIS — F1210 Cannabis abuse, uncomplicated: Secondary | ICD-10-CM | POA: Insufficient documentation

## 2012-10-31 DIAGNOSIS — Z79899 Other long term (current) drug therapy: Secondary | ICD-10-CM | POA: Insufficient documentation

## 2012-10-31 DIAGNOSIS — I10 Essential (primary) hypertension: Secondary | ICD-10-CM | POA: Insufficient documentation

## 2012-10-31 DIAGNOSIS — F17200 Nicotine dependence, unspecified, uncomplicated: Secondary | ICD-10-CM | POA: Insufficient documentation

## 2012-10-31 DIAGNOSIS — R1110 Vomiting, unspecified: Secondary | ICD-10-CM | POA: Insufficient documentation

## 2012-10-31 LAB — COMPREHENSIVE METABOLIC PANEL
AST: 15 U/L (ref 0–37)
Albumin: 4.5 g/dL (ref 3.5–5.2)
BUN: 7 mg/dL (ref 6–23)
Chloride: 97 mEq/L (ref 96–112)
Creatinine, Ser: 0.85 mg/dL (ref 0.50–1.35)
Potassium: 3.4 mEq/L — ABNORMAL LOW (ref 3.5–5.1)
Total Bilirubin: 0.4 mg/dL (ref 0.3–1.2)
Total Protein: 8 g/dL (ref 6.0–8.3)

## 2012-10-31 LAB — CBC WITH DIFFERENTIAL/PLATELET
Basophils Absolute: 0 10*3/uL (ref 0.0–0.1)
Basophils Relative: 0 % (ref 0–1)
Eosinophils Absolute: 0 10*3/uL (ref 0.0–0.7)
MCH: 34.1 pg — ABNORMAL HIGH (ref 26.0–34.0)
MCHC: 35.5 g/dL (ref 30.0–36.0)
Monocytes Absolute: 0.7 10*3/uL (ref 0.1–1.0)
Monocytes Relative: 7 % (ref 3–12)
Neutro Abs: 7.2 10*3/uL (ref 1.7–7.7)
Neutrophils Relative %: 71 % (ref 43–77)
RDW: 12.7 % (ref 11.5–15.5)

## 2012-10-31 LAB — LIPASE, BLOOD: Lipase: 46 U/L (ref 11–59)

## 2012-10-31 MED ORDER — GI COCKTAIL ~~LOC~~
30.0000 mL | Freq: Once | ORAL | Status: AC
  Administered 2012-10-31: 30 mL via ORAL
  Filled 2012-10-31: qty 30

## 2012-10-31 MED ORDER — DIPHENHYDRAMINE HCL 50 MG/ML IJ SOLN
25.0000 mg | Freq: Once | INTRAMUSCULAR | Status: AC
  Administered 2012-10-31: 25 mg via INTRAVENOUS
  Filled 2012-10-31: qty 1

## 2012-10-31 MED ORDER — MORPHINE SULFATE 4 MG/ML IJ SOLN
4.0000 mg | Freq: Once | INTRAMUSCULAR | Status: AC
  Administered 2012-10-31: 4 mg via INTRAVENOUS
  Filled 2012-10-31: qty 1

## 2012-10-31 MED ORDER — DICYCLOMINE HCL 10 MG PO CAPS
10.0000 mg | ORAL_CAPSULE | Freq: Once | ORAL | Status: AC
  Administered 2012-10-31: 10 mg via ORAL
  Filled 2012-10-31: qty 1

## 2012-10-31 MED ORDER — HYDROCODONE-ACETAMINOPHEN 5-325 MG PO TABS: 2.0000 | ORAL_TABLET | ORAL | Status: DC | PRN

## 2012-10-31 MED ORDER — METOCLOPRAMIDE HCL 5 MG/ML IJ SOLN
10.0000 mg | Freq: Once | INTRAMUSCULAR | Status: AC
  Administered 2012-10-31: 10 mg via INTRAVENOUS
  Filled 2012-10-31: qty 2

## 2012-10-31 MED ORDER — CLONIDINE HCL 0.2 MG PO TABS
0.2000 mg | ORAL_TABLET | Freq: Once | ORAL | Status: AC
  Administered 2012-10-31: 0.2 mg via ORAL
  Filled 2012-10-31: qty 1

## 2012-10-31 MED ORDER — DICYCLOMINE HCL 20 MG PO TABS: 20.0000 mg | ORAL_TABLET | Freq: Two times a day (BID) | ORAL | Status: DC

## 2012-10-31 MED ORDER — ONDANSETRON 4 MG PO TBDP
8.0000 mg | ORAL_TABLET | Freq: Once | ORAL | Status: AC
  Administered 2012-10-31: 8 mg via ORAL

## 2012-10-31 MED ORDER — ESOMEPRAZOLE MAGNESIUM 40 MG PO CPDR: 40.0000 mg | DELAYED_RELEASE_CAPSULE | Freq: Every day | ORAL | Status: DC

## 2012-10-31 NOTE — ED Provider Notes (Addendum)
History     CSN: [id number]  Arrival date & time 10/31/12  4696   First MD Initiated Contact with Patient 10/31/12 0138      Chief Complaint  Patient presents with  . Emesis    (Consider location/radiation/quality/duration/timing/severity/associated sxs/prior treatment) Patient is a 47 y.o. male presenting with vomiting. The history is provided by the patient.  Emesis  This is a recurrent problem. The current episode started yesterday. The problem occurs more than 10 times per day. The problem has not changed since onset.The emesis has an appearance of stomach contents. There has been no fever.    Past Medical History  Diagnosis Date  . Acid reflux   . Hypertension     History reviewed. No pertinent past surgical history.  History reviewed. No pertinent family history.  History  Substance Use Topics  . Smoking status: Current Every Day Smoker -- 0.5 packs/day    Types: Cigarettes  . Smokeless tobacco: Not on file  . Alcohol Use: No      Review of Systems  Gastrointestinal: Positive for vomiting.  All other systems reviewed and are negative.    Allergies  Review of patient's allergies indicates no known allergies.  Home Medications   Current Outpatient Rx  Name  Route  Sig  Dispense  Refill  . OMEPRAZOLE 20 MG PO CPDR   Oral   Take 20 mg by mouth daily.           BP 176/102  Pulse 69  Temp 98.1 F (36.7 C) (Oral)  Resp 16  SpO2 99%  Physical Exam  Constitutional: He is oriented to person, place, and time. He appears well-developed and well-nourished.  HENT:  Head: Normocephalic and atraumatic.  Eyes: Conjunctivae normal are normal. Pupils are equal, round, and reactive to light.  Neck: Normal range of motion. Neck supple.  Cardiovascular: Normal rate, regular rhythm, normal heart sounds and intact distal pulses.   Pulmonary/Chest: Effort normal and breath sounds normal.  Abdominal: Soft. Bowel sounds are normal. There is tenderness.     Neurological: He is alert and oriented to person, place, and time.  Skin: Skin is warm and dry.  Psychiatric: He has a normal mood and affect. His behavior is normal. Judgment and thought content normal.    ED Course  Procedures (including critical care time)  Labs Reviewed  CBC WITH DIFFERENTIAL - Abnormal; Notable for the following:    RBC 4.14 (*)     MCH 34.1 (*)     All other components within normal limits  COMPREHENSIVE METABOLIC PANEL - Abnormal; Notable for the following:    Potassium 3.4 (*)     Glucose, Bld 117 (*)     All other components within normal limits  LIPASE, BLOOD   No results found.   No diagnosis found.    MDM  + hx of acid reflux,  Noncompliant with ppi.  Benign labs.  Will analgesia,  Medicate,  reassess    Improved.  Labs benign.  Will dc to oupt fu, ret new/worsening sxs    Temisha Murley Lytle Michaels, MD 10/31/12 0240  Elyan Vanwieren Lytle Michaels, MD 10/31/12 [phone]

## 2012-10-31 NOTE — ED Notes (Signed)
Pt states that he has been vomiting all day. Pt states "a whole lot." pt has emesis bag with him in triage but has not vomited. Pt denies diarrhea. Pt states abdominal pain that started a while ago across the top of his abdomen.

## 2012-10-31 NOTE — ED Notes (Signed)
EDP Updated re: discharge BP and orders rec'd

## 2013-11-24 IMAGING — CT CT ABD-PELV W/ CM
2 of 5 series · 17 of 46 positions shown, 19 images · IV contrast (omnipaque)
Comparison: 08/13/2011.

CLINICAL DATA: Chest and abdominal pain.  Shortness of breath.

CT ABDOMEN AND PELVIS WITH CONTRAST
TECHNIQUE: Multidetector CT imaging of the abdomen and pelvis was
performed following the standard protocol during bolus
administration of intravenous contrast.
Contrast: 80mL OMNIPAQUE IOHEXOL 300 MG/ML  SOLN

[Series 2: routine abdomen · axial · 0.81mm/px · z∈[-466,-47]mm · 14 of 114 slices shown, 16 images]
[im 6/114  soft-tissue]
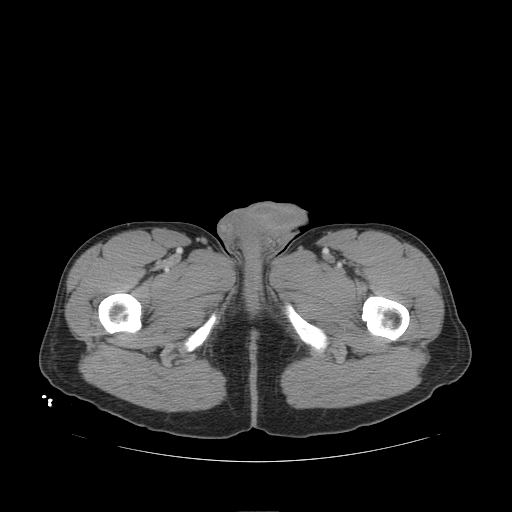
[im 6/114  bone]
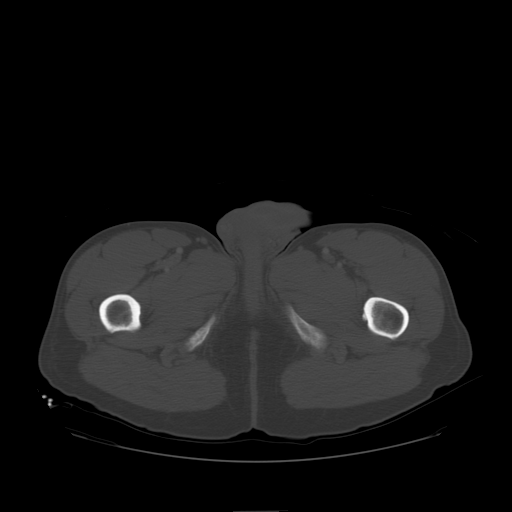
[im 17/114  soft-tissue]
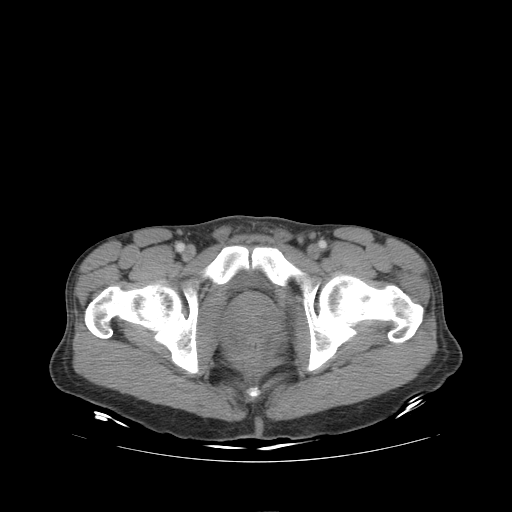
[im 23/114  soft-tissue]
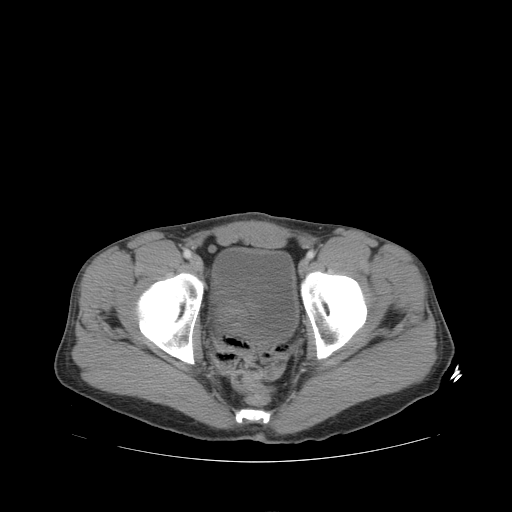
[im 29/114  soft-tissue]
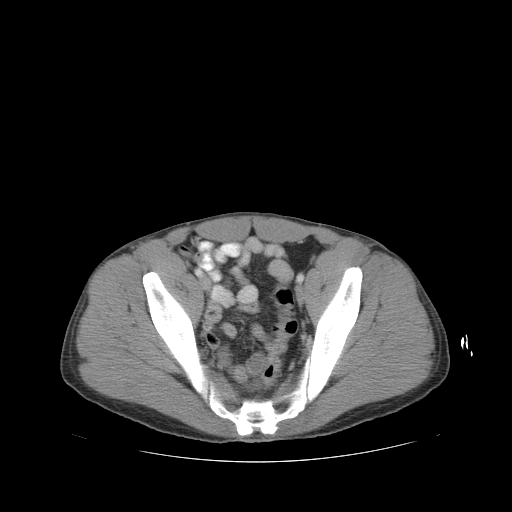
[im 40/114  soft-tissue]
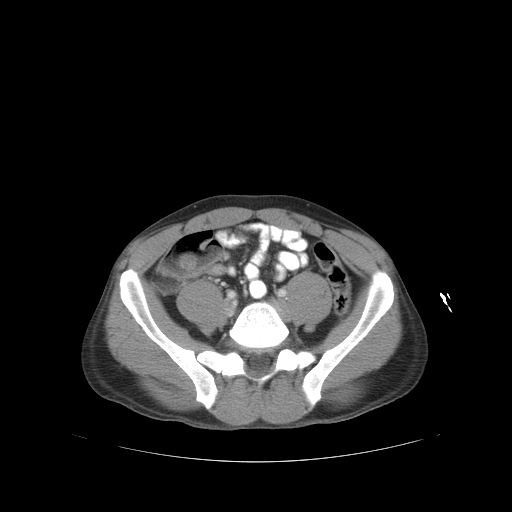
[im 46/114  soft-tissue]
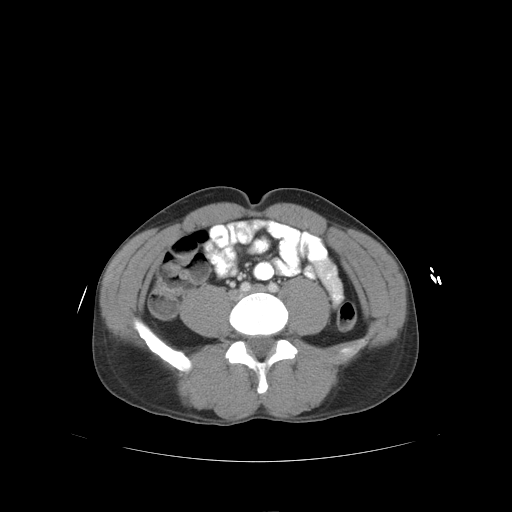
[im 51/114  soft-tissue]
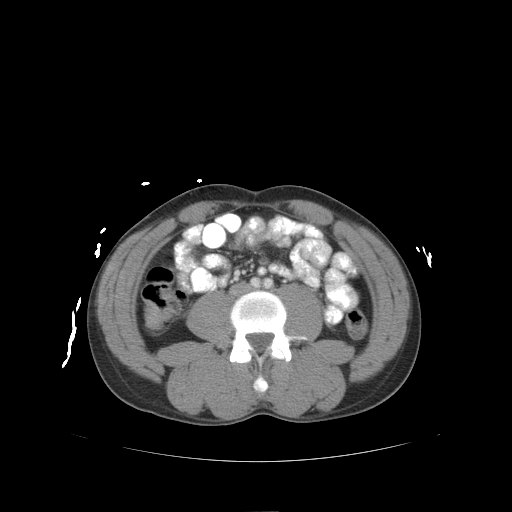
[im 63/114  soft-tissue]
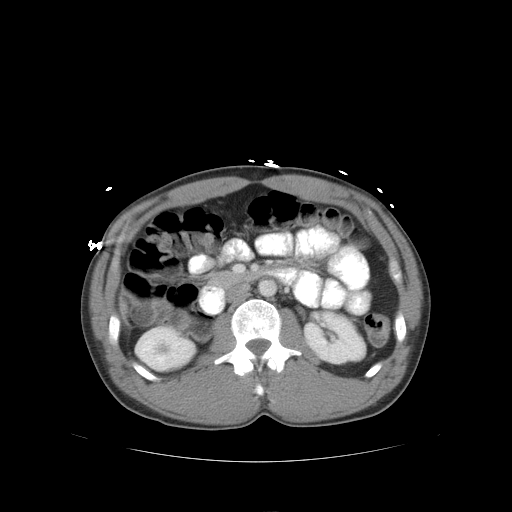
[im 68/114  soft-tissue]
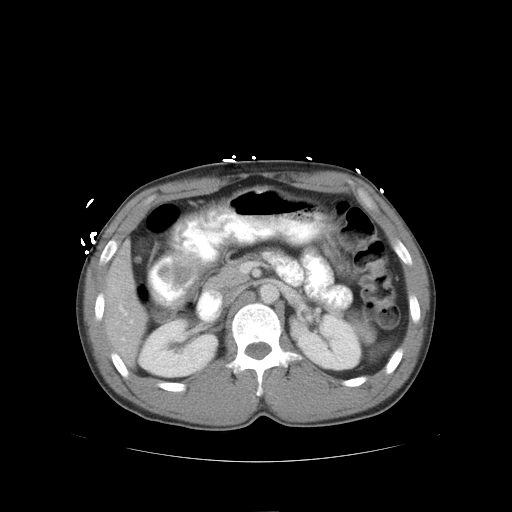
[im 68/114  bone]
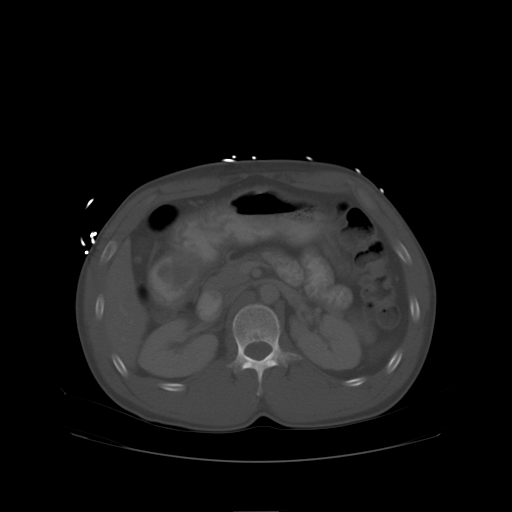
[im 74/114  soft-tissue]
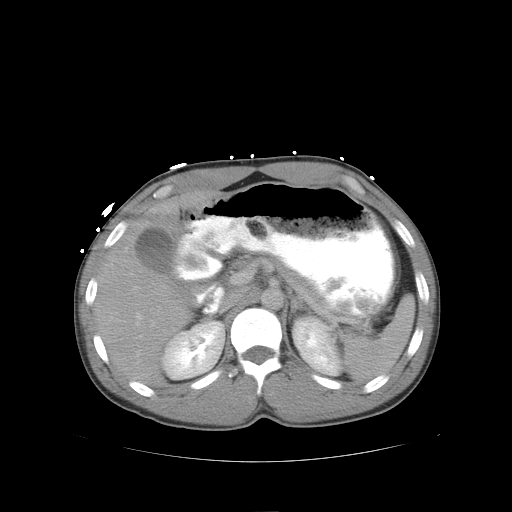
[im 85/114  soft-tissue]
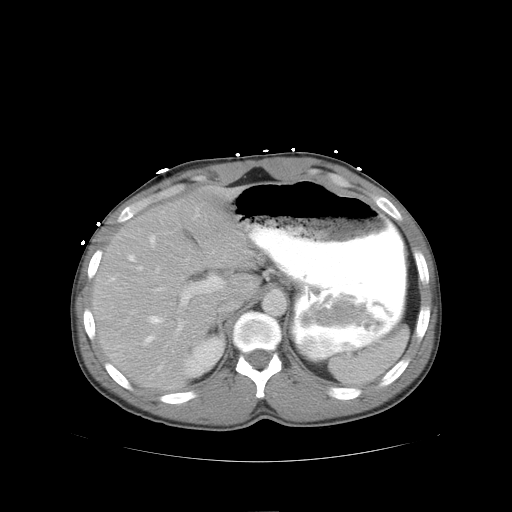
[im 91/114  soft-tissue]
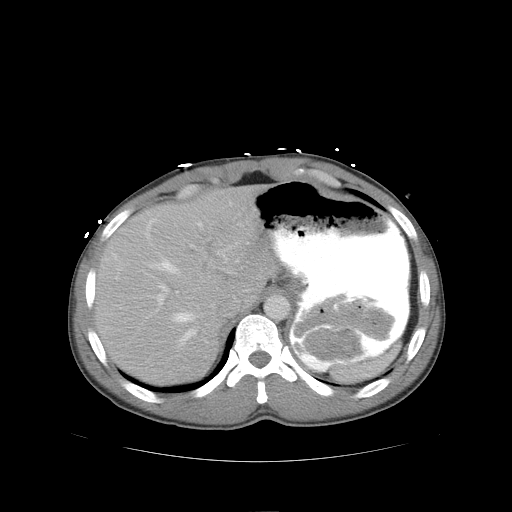
[im 97/114  soft-tissue]
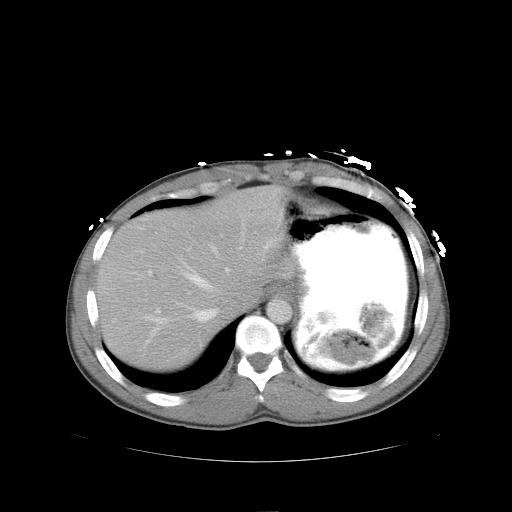
[im 108/114  soft-tissue]
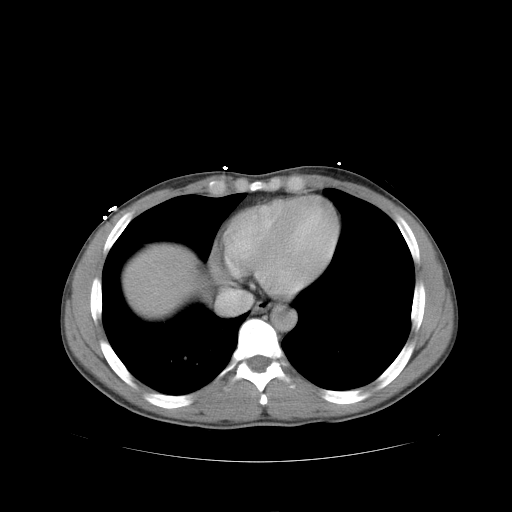

[Series 401: coronal · coronal · 0.90mm/px · 3 of 83 slices shown]
[im 28/83  soft-tissue]
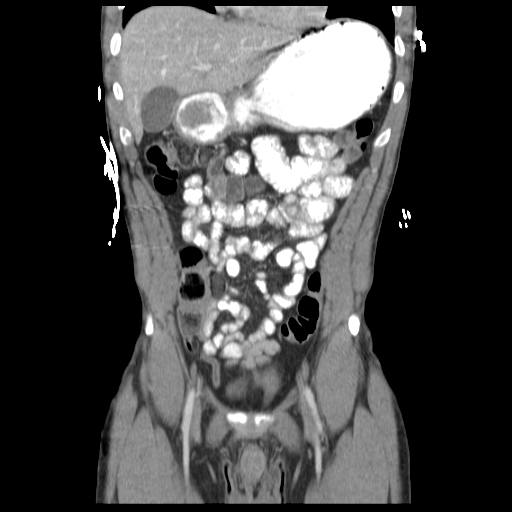
[im 37/83  soft-tissue]
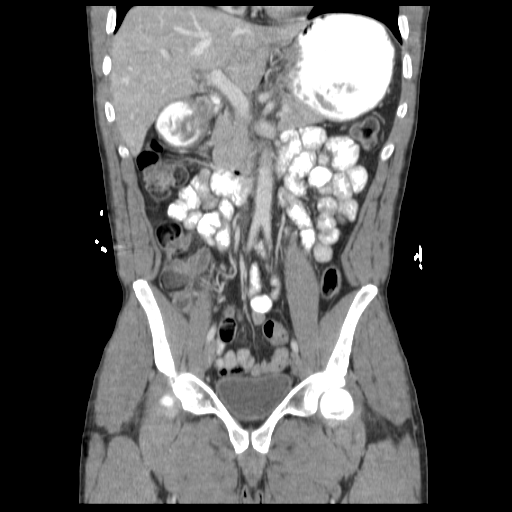
[im 46/83  soft-tissue]
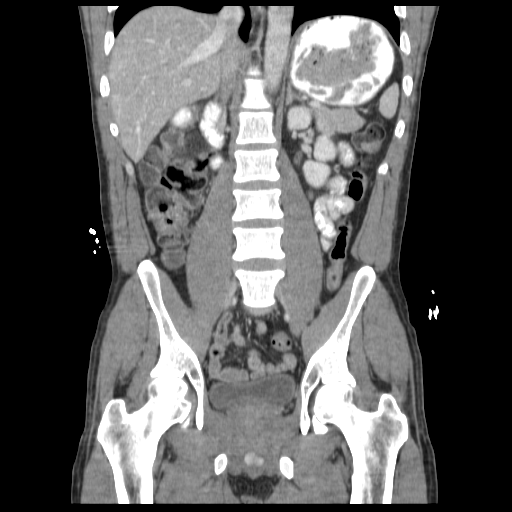

[17 of 46 positions shown; findings below may reference images not displayed]

FINDINGS: Lung bases:  Clear lung bases.  Normal heart size without
pericardial or pleural effusion.

Abdomen/pelvis:  Normal liver, spleen.  Prominent food within the
gastric body and antrum.  Normal pancreas, gallbladder, biliary
tract, adrenal glands, kidneys. No retroperitoneal or retrocrural
adenopathy.

Scattered colonic diverticula.  Normal terminal ileum and appendix.
Normal small bowel without abdominal ascites. No pelvic adenopathy.
Normal urinary bladder and prostate.  No significant free fluid.

Bones/Musculoskeletal:  No acute osseous abnormality.
IMPRESSION: No acute process in the abdomen or pelvis.

## 2015-08-21 ENCOUNTER — Encounter (HOSPITAL_COMMUNITY): Payer: Self-pay

## 2015-08-21 ENCOUNTER — Emergency Department (HOSPITAL_COMMUNITY)
Admission: EM | Admit: 2015-08-21 | Discharge: 2015-08-21 | Disposition: A | Discharge status: Home / Self Care | Payer: Self-pay | Attending: Emergency Medicine | Admitting: Emergency Medicine

## 2015-08-21 ENCOUNTER — Emergency Department (HOSPITAL_COMMUNITY): Payer: Self-pay

## 2015-08-21 DIAGNOSIS — K219 Gastro-esophageal reflux disease without esophagitis: Secondary | ICD-10-CM | POA: Insufficient documentation

## 2015-08-21 DIAGNOSIS — M7662 Achilles tendinitis, left leg: Secondary | ICD-10-CM

## 2015-08-21 DIAGNOSIS — Z720 Tobacco use: Secondary | ICD-10-CM | POA: Insufficient documentation

## 2015-08-21 DIAGNOSIS — I10 Essential (primary) hypertension: Secondary | ICD-10-CM | POA: Insufficient documentation

## 2015-08-21 NOTE — ED Provider Notes (Signed)
CSN: [id number]     Arrival date & time 08/21/15  [id number] History   First MD Initiated Contact with Patient 08/21/15 [phone]     Chief Complaint  Patient presents with  . Ankle Pain   HPI   49 year old male presents today with left heel pain. Patient reports that yesterday while he was at work doing some outdoor activities he felt sharp pain developing in his left heel. He reports pain was resolved by sitting, but any pressure to his feet causes the sharp pain. He denies any acute injuries to the ankle or foot, or history of the same. He reports using arthritis cream on it with no significant help. Patient denies any swelling to the foot or ankle, history of infections, gout, rash. He has no loss of distal sensation strength or motor function, and has very minimal pain with active plantar and dorsiflexion without resistance. Patient denies fever, chills, nausea, vomiting.  Past Medical History  Diagnosis Date  . Acid reflux   . Hypertension    History reviewed. No pertinent past surgical history. No family history on file. Social History  Substance Use Topics  . Smoking status: Current Every Day Smoker -- 0.50 packs/day    Types: Cigarettes  . Smokeless tobacco: None  . Alcohol Use: No    Review of Systems  All other systems reviewed and are negative.   Allergies  Review of patient's allergies indicates no known allergies.  Home Medications   Prior to Admission medications   Medication Sig Start Date End Date Taking? Authorizing Provider  dicyclomine (BENTYL) 20 MG tablet Take 1 tablet (20 mg total) by mouth 2 (two) times daily. Patient not taking: Reported on 08/21/2015 10/31/12   Mendel Ryderhionesu Sonyika, MD  esomeprazole (NEXIUM) 40 MG capsule Take 1 capsule (40 mg total) by mouth daily. Patient not taking: Reported on 08/21/2015 10/31/12   Mendel Ryderhionesu Sonyika, MD  HYDROcodone-acetaminophen (NORCO/VICODIN) 5-325 MG per tablet Take 2 tablets by mouth every 4 (four) hours as needed for  pain. Patient not taking: Reported on 08/21/2015 10/31/12   Chionesu Sonyika, MD   BP 143/88 mmHg  Pulse 69  Temp(Src) 98.1 F (36.7 C) (Oral)  Resp 16  SpO2 99%   Physical Exam  Constitutional: He is oriented to person, place, and time. He appears well-developed and well-nourished.  HENT:  Head: Normocephalic and atraumatic.  Eyes: Conjunctivae are normal. Pupils are equal, round, and reactive to light. Right eye exhibits no discharge. Left eye exhibits no discharge. No scleral icterus.  Neck: Normal range of motion. No JVD present. No tracheal deviation present.  Pulmonary/Chest: Effort normal. No stridor.  Musculoskeletal:  Nose obvious swelling or deformities of the foot and ankle or calf, symmetrical bilateral. Point tenderness to the insertion of the Achilles tendon. Pain with resisted plantar flexion, very minimal pain with passive plantar and dorsiflexion. Joint is supple with full active range of motion, not warm to touch, no signs of rash, distal sensation intact, cap refill less than 3 seconds, pedal pulses 2+  Neurological: He is alert and oriented to person, place, and time. Coordination normal.  Psychiatric: He has a normal mood and affect. His behavior is normal. Judgment and thought content normal.  Nursing note and vitals reviewed.     ED Course  Procedures (including critical care time) Labs Review Labs Reviewed - No data to display  Imaging Review Dg Os Calcis Left  08/21/2015  CLINICAL DATA:  Left heel pain. No known injury. Initial encounter. EXAM: LEFT OS  CALCIS - 2+ VIEW COMPARISON:  None. FINDINGS: No acute bony or joint abnormality is identified. Mild spurring at the Achilles tendon insertion is noted. Soft tissues are unremarkable. IMPRESSION: No acute abnormality.  Mild spurring Achilles tendon insertion. Electronically Signed   By: Drusilla Kanner M.D.   On: 08/21/2015 07:09   I have personally reviewed and evaluated these images and lab results as part  of my medical decision-making.   EKG Interpretation None      MDM   Final diagnoses:  Achilles tendinitis of left lower extremity    Labs:  Imaging: DG Calc shows mild spurring at the Achilles tendon insertion  Consults:  Therapeutics:  Discharge Meds:   Assessment/Plan: Patient's presentation most consistent with Achilles tendinitis. Point tenderness to the insertion point. Negative Thompson, no signs of swelling that would indicate Gout, septic arthritis, or tendon rupture. Patient was given care instructions including ice, ibuprofen, rest, encouraged to follow up with primary care provider in one week for reevaluation of symptoms persist. Strict return precautions given.          Eyvonne Mechanic, PA-C 08/21/15 1610  April Palumbo, MD 08/21/15 [phone]

## 2015-08-21 NOTE — ED Notes (Signed)
Patient transported to X-ray 

## 2015-08-21 NOTE — Discharge Instructions (Signed)
Achilles Tendinitis Achilles tendinitis is inflammation of the tough, cord-like band that attaches the lower muscles of your leg to your heel (Achilles tendon). It is usually caused by overusing the tendon and joint involved.  CAUSES Achilles tendinitis can happen because of:  A sudden increase in exercise or activity (such as running).  Doing the same exercises or activities (such as jumping) over and over.  Not warming up calf muscles before exercising.  Exercising in shoes that are worn out or not made for exercise.  Having arthritis or a bone growth on the back of the heel bone. This can rub against the tendon and hurt the tendon. SIGNS AND SYMPTOMS The most common symptoms are:  Pain in the back of the leg, just above the heel. The pain usually gets worse with exercise and better with rest.  Stiffness or soreness in the back of the leg, especially in the morning.  Swelling of the skin over the Achilles tendon.  Trouble standing on tiptoe. Sometimes, an Achilles tendon tears (ruptures). Symptoms of an Achilles tendon rupture can include:  Sudden, severe pain in the back of the leg.  Trouble putting weight on the foot or walking normally. DIAGNOSIS Achilles tendinitis will be diagnosed based on symptoms and a physical examination. An X-ray may be done to check if another condition is causing your symptoms. An MRI may be ordered if your health care provider suspects you may have completely torn your tendon, which is called an Achilles tendon rupture.  TREATMENT  Achilles tendinitis usually gets better over time. It can take weeks to months to heal completely. Treatment focuses on treating the symptoms and helping the injury heal. HOME CARE INSTRUCTIONS   Rest your Achilles tendon and avoid activities that cause pain.  Apply ice to the injured area:  Put ice in a plastic bag.  Place a towel between your skin and the bag.  Leave the ice on for 20 minutes, 2-3 times a  day  Try to avoid using the tendon (other than gentle range of motion) while the tendon is painful. Do not resume use until instructed by your health care provider. Then begin use gradually. Do not increase use to the point of pain. If pain does develop, decrease use and continue the above measures. Gradually increase activities that do not cause discomfort until you achieve normal use.  Do exercises to make your calf muscles stronger and more flexible. Your health care provider or physical therapist can recommend exercises for you to do.  Wrap your ankle with an elastic bandage or other wrap. This can help keep your tendon from moving too much. Your health care provider will show you how to wrap your ankle correctly.  Only take over-the-counter or prescription medicines for pain, discomfort, or fever as directed by your health care provider. SEEK MEDICAL CARE IF:   Your pain and swelling increase or pain is uncontrolled with medicines.  You develop new, unexplained symptoms or your symptoms get worse.  You are unable to move your toes or foot.  You develop warmth and swelling in your foot.  You have an unexplained temperature. MAKE SURE YOU:   Understand these instructions.  Will watch your condition.  Will get help right away if you are not doing well or get worse.   This information is not intended to replace advice given to you by your health care provider. Make sure you discuss any questions you have with your health care provider.   Document Released:  07/07/2005 Document Revised: 10/18/2014 Document Reviewed: 05/09/2013 Elsevier Interactive Patient Education 2016 [employer]   Please use ibuprofen, 600 mg 3 times a day for 5 days in conjunction with ice, and rest

## 2015-08-21 NOTE — ED Notes (Signed)
Per pt he is having left ankle pain that started hurting on Wednesday when he was at work. Pt states that he does not have pain when sitting or lying but does have pain when he stands or puts pressure. Pt denies numbness. Pt denies doing anything that may have injured the ankle. No deformity noted, no swelling noted. Pt states that he put arthritis cream on the ankle to attempt to relieve the pain, this did not help with the pain.

## 2016-06-27 ENCOUNTER — Encounter (HOSPITAL_COMMUNITY): Payer: Self-pay | Admitting: *Deleted

## 2016-06-27 ENCOUNTER — Emergency Department (HOSPITAL_COMMUNITY)
Admission: EM | Admit: 2016-06-27 | Discharge: 2016-06-27 | Disposition: A | Discharge status: Home / Self Care | Payer: Self-pay | Attending: Emergency Medicine | Admitting: Emergency Medicine

## 2016-06-27 DIAGNOSIS — I10 Essential (primary) hypertension: Secondary | ICD-10-CM | POA: Insufficient documentation

## 2016-06-27 DIAGNOSIS — F17210 Nicotine dependence, cigarettes, uncomplicated: Secondary | ICD-10-CM | POA: Insufficient documentation

## 2016-06-27 DIAGNOSIS — L0201 Cutaneous abscess of face: Secondary | ICD-10-CM | POA: Insufficient documentation

## 2016-06-27 MED ORDER — HYDROCODONE-ACETAMINOPHEN 5-325 MG PO TABS
1.0000 | ORAL_TABLET | Freq: Once | ORAL | Status: AC
  Administered 2016-06-27: 1 via ORAL
  Filled 2016-06-27: qty 1

## 2016-06-27 MED ORDER — LIDOCAINE-EPINEPHRINE (PF) 2 %-1:200000 IJ SOLN: 10.0000 mL | Freq: Once | INTRAMUSCULAR | Status: DC

## 2016-06-27 MED ORDER — SULFAMETHOXAZOLE-TRIMETHOPRIM 800-160 MG PO TABS: 1.0000 | ORAL_TABLET | Freq: Two times a day (BID) | ORAL | 0 refills | Status: AC

## 2016-06-27 MED ORDER — IBUPROFEN 800 MG PO TABS: 800.0000 mg | ORAL_TABLET | Freq: Three times a day (TID) | ORAL | 0 refills | Status: DC | PRN

## 2016-06-27 NOTE — ED Provider Notes (Signed)
[facility]-EMERGENCY DEPT Provider Note   CSN: [id number] Arrival date & time: 06/27/16  0537     History   Chief Complaint Chief Complaint  Patient presents with  . Oral Swelling    HPI Todd Allen is a 50 y.o. male.  HPI   Pt presents with right sided facial lesion that began 2 weeks ago when he went to the barber to have his facial hair trimmed.  States the swelling has improved overall but there has been no discharge.  He has tried warm compresses, tweezing out all the hair, and squeezing it without resolution.  Denies fevers, dental problems, oral swelling, difficulty swallowing or breathing.    Past Medical History:  Diagnosis Date  . Acid reflux   . Hypertension     There are no active problems to display for this patient.   History reviewed. No pertinent surgical history.     Home Medications    Prior to Admission medications   Medication Sig Start Date End Date Taking? Authorizing Provider  dicyclomine (BENTYL) 20 MG tablet Take 1 tablet (20 mg total) by mouth 2 (two) times daily. Patient not taking: Reported on 08/21/2015 10/31/12   Mendel Ryderhionesu Sonyika, MD  esomeprazole (NEXIUM) 40 MG capsule Take 1 capsule (40 mg total) by mouth daily. Patient not taking: Reported on 08/21/2015 10/31/12   Mendel Ryderhionesu Sonyika, MD  HYDROcodone-acetaminophen (NORCO/VICODIN) 5-325 MG per tablet Take 2 tablets by mouth every 4 (four) hours as needed for pain. Patient not taking: Reported on 08/21/2015 10/31/12   Mendel Ryderhionesu Sonyika, MD  ibuprofen (ADVIL,MOTRIN) 800 MG tablet Take 1 tablet (800 mg total) by mouth every 8 (eight) hours as needed for mild pain or moderate pain. 06/27/16   Trixie DredgeEmily Bessie Boyte, PA-C  sulfamethoxazole-trimethoprim (BACTRIM DS,SEPTRA DS) 800-160 MG tablet Take 1 tablet by mouth 2 (two) times daily. 06/27/16 07/04/16  Trixie DredgeEmily Andry Bogden, PA-C    Family History No family history on file.  Social History Social History  Substance Use Topics  . Smoking status: Current Every Day  Smoker    Packs/day: 0.50    Types: Cigarettes  . Smokeless tobacco: Never Used  . Alcohol use No     Allergies   Review of patient's allergies indicates no known allergies.   Review of Systems Review of Systems  Constitutional: Negative for fever.  HENT: Positive for facial swelling. Negative for dental problem, sore throat and trouble swallowing.   Skin: Negative for color change, pallor and rash.  Allergic/Immunologic: Negative for immunocompromised state.  Psychiatric/Behavioral: Negative for self-injury.     Physical Exam Updated Vital Signs BP 150/86 (BP Location: Right Arm)   Pulse 80   Temp 98 F (36.7 C) (Oral)   Resp 16   Wt 66.3 kg   SpO2 100%   Physical Exam  Constitutional: He appears well-developed and well-nourished. No distress.  HENT:  Head: Normocephalic and atraumatic.    Neck: Neck supple.  Pulmonary/Chest: Effort normal.  Neurological: He is alert.  Skin: He is not diaphoretic.  Nursing note and vitals reviewed.    ED Treatments / Results  Labs (all labs ordered are listed, but only abnormal results are displayed) Labs Reviewed - No data to display  EKG  EKG Interpretation None       Radiology No results found.  Procedures Procedures (including critical care time)  INCISION AND DRAINAGE Performed by: Trixie DredgeWEST, Iness Pangilinan Consent: Verbal consent obtained. Risks and benefits: risks, benefits and alternatives were discussed Type: abscess  Body area: right face  Anesthesia: local infiltration  Incision was made with a scalpel.  Local anesthetic: lidocaine 1% with epinephrine  Anesthetic total: 2 ml  Complexity: complex Blunt dissection to break up loculations  Drainage: purulent  Drainage amount: smal  Packing material: none  Patient tolerance: Patient tolerated the procedure well with no immediate complications.   EMERGENCY DEPARTMENT US SOFT TISSUE INTERPRETATION "Study: Limited Ultrasound of the noted body part in  comments below"  INDICATIONS: Pain Multiple views of the body part are obtained with a multi-frequency linear probe  PERFORMED BY:  Myself  IMAGES ARCHIVED?: Yes  SIDE:Right   BODY PART:Other soft tisse (comment in note)  FINDINGS: Abcess present  LIMITATIONS:  Emergent Procedure  INTERPRETATION:  Abcess present  COMMENT:  Right facial abscess    Medications Ordered in ED Medications  HYDROcodone-acetaminophen (NORCO/VICODIN) 5-325 MG per tablet 1 tablet (1 tablet Oral Given 06/27/16 0904)     Initial Impression / Assessment and Plan / ED Course  I have reviewed the triage vital signs and the nursing notes.  Pertinent labs & imaging results that were available during my care of the patient were reviewed by me and considered in my medical decision making (see chart for details).  Clinical Course    Afebrile, nontoxic patient with facial abscess.   D/C home with bactrim, return precautions.   Discussed result, findings, treatment, and follow up  with patient.  Pt given return precautions.  Pt verbalizes understanding and agrees with plan.       Final Clinical Impressions(s) / ED Diagnoses   Final diagnoses:  Facial abscess    New Prescriptions Discharge Medication List as of 06/27/2016  9:15 AM    START taking these medications   Details  ibuprofen (ADVIL,MOTRIN) 800 MG tablet Take 1 tablet (800 mg total) by mouth every 8 (eight) hours as needed for mild pain or moderate pain., Starting Sun 06/27/2016, Print    sulfamethoxazole-trimethoprim (BACTRIM DS,SEPTRA DS) 800-160 MG tablet Take 1 tablet by mouth 2 (two) times daily., Starting Sun 06/27/2016, Until Sun 07/04/2016, Print         [place], PA-C 06/27/16 1131    Shaune Pollack, MD 06/29/16 [phone]

## 2016-06-27 NOTE — ED Triage Notes (Signed)
The pt is c/o a bout a lesion to the rt corner of his  Mouth.  He was  Seen at a barber shop 1-2 weeks ago to get his mustache trimmed.  The next day a pimple  Was visible  He attempted to mash it  And it has grown larger,

## 2016-06-27 NOTE — Discharge Instructions (Signed)
Read the information below.  Use the prescribed medication as directed.  Please discuss all new medications with your pharmacist.  You may return to the Emergency Department at any time for worsening condition or any new symptoms that concern you.     Use warm moist compresses on your face several times daily to encourage drainage.  If you develop redness, swelling, increased pain, or fevers greater than 100.4, return to the ER immediately for a recheck.

## 2016-06-27 NOTE — ED Notes (Signed)
Declined W/C at D/C and was escorted to lobby by RN. 

## 2019-08-17 ENCOUNTER — Other Ambulatory Visit: Payer: Self-pay

## 2019-08-17 ENCOUNTER — Emergency Department (HOSPITAL_COMMUNITY): Payer: Worker's Compensation

## 2019-08-17 ENCOUNTER — Emergency Department (HOSPITAL_COMMUNITY)
Admission: EM | Admit: 2019-08-17 | Discharge: 2019-08-17 | Disposition: A | Discharge status: Home / Self Care | Payer: Worker's Compensation | Attending: Emergency Medicine | Admitting: Emergency Medicine

## 2019-08-17 ENCOUNTER — Encounter (HOSPITAL_COMMUNITY): Payer: Self-pay | Admitting: Emergency Medicine

## 2019-08-17 ENCOUNTER — Emergency Department (HOSPITAL_COMMUNITY): Payer: Self-pay

## 2019-08-17 DIAGNOSIS — Y990 Civilian activity done for income or pay: Secondary | ICD-10-CM | POA: Diagnosis not present

## 2019-08-17 DIAGNOSIS — W231XXA Caught, crushed, jammed, or pinched between stationary objects, initial encounter: Secondary | ICD-10-CM | POA: Diagnosis not present

## 2019-08-17 DIAGNOSIS — S99921A Unspecified injury of right foot, initial encounter: Secondary | ICD-10-CM | POA: Diagnosis present

## 2019-08-17 DIAGNOSIS — S9031XA Contusion of right foot, initial encounter: Secondary | ICD-10-CM | POA: Diagnosis not present

## 2019-08-17 DIAGNOSIS — Y929 Unspecified place or not applicable: Secondary | ICD-10-CM | POA: Diagnosis not present

## 2019-08-17 DIAGNOSIS — I10 Essential (primary) hypertension: Secondary | ICD-10-CM | POA: Diagnosis not present

## 2019-08-17 DIAGNOSIS — F17210 Nicotine dependence, cigarettes, uncomplicated: Secondary | ICD-10-CM | POA: Insufficient documentation

## 2019-08-17 DIAGNOSIS — Y939 Activity, unspecified: Secondary | ICD-10-CM | POA: Diagnosis not present

## 2019-08-17 MED ORDER — HYDROCODONE-ACETAMINOPHEN 5-325 MG PO TABS: 1.0000 | ORAL_TABLET | Freq: Four times a day (QID) | ORAL | 0 refills | Status: DC | PRN

## 2019-08-17 MED ORDER — CHLORTHALIDONE 25 MG PO TABS: 12.5000 mg | ORAL_TABLET | Freq: Every day | ORAL | 0 refills | Status: DC

## 2019-08-17 NOTE — Discharge Instructions (Signed)
Please read and follow all provided instructions.  Your diagnoses today include:  1. Contusion of right foot, initial encounter   2. Hypertension, unspecified type     Tests performed today include:  An x-ray of the affected area - does NOT show any broken bones  Vital signs. See below for your results today.   Medications prescribed:   Chlorthalidone - medication for high blood pressure   Vicodin (hydrocodone/acetaminophen) - narcotic pain medication  DO NOT drive or perform any activities that require you to be awake and alert because this medicine can make you drowsy. BE VERY CAREFUL not to take multiple medicines containing Tylenol (also called acetaminophen). Doing so can lead to an overdose which can damage your liver and cause liver failure and possibly death.  Take any prescribed medications only as directed.  Home care instructions:   Follow any educational materials contained in this packet  Follow R.I.C.E. Protocol:  R - rest your injury   I  - use ice on injury without applying directly to skin  C - compress injury with bandage or splint  E - elevate the injury as much as possible  Follow-up instructions: Please follow-up with your primary care provider in 1 week for blood pressure recheck.    Return instructions:   Please return if your toes or feet are numb or tingling, appear gray or blue, or you have severe pain (also elevate the leg and loosen splint or wrap if you were given one)  Please return to the Emergency Department if you experience worsening symptoms.   Please return if you have any other emergent concerns.  Additional Information:  Your vital signs today were: BP (!) 206/113    Pulse 71    Temp 98.6 F (37 C) (Oral)    Resp 20    SpO2 96%  If your blood pressure (BP) was elevated above 135/85 this visit, please have this repeated by your doctor within one month. -------------- If prescribed crutches for your injury: use crutches with  non-weight bearing for the first few days. Then, you may walk as the pain allows, or as instructed. Start gradually with weight bearing on the affected side. Once you can walk pain free, then try jogging. When you can run forwards, then you can try moving side-to-side. If you cannot walk without crutches in one week, you need a re-check. --------------

## 2019-08-17 NOTE — ED Provider Notes (Signed)
[facility] DEPT Provider Note   CSN: [id number] Arrival date & time: 08/17/19  1224     History   Chief Complaint Chief Complaint  Patient presents with  . Foot Pain    HPI Todd Allen is a 53 y.o. male.     Patient presents the emergency department acute onset of right foot pain starting just prior to arrival when a steel plate fell over onto his foot.  His foot was not pinned.  He has pain on the top of his foot that radiates into his ankle.  No treatments prior to arrival.  No knee or hip pain.  Patient's chart reports history of hypertension.  Patient states that he does not have hypertension but runs in his family.  Blood pressure is 206/113 on arrival today.  Patient with elevated blood pressures, however not this high, noted on ED visits dating back to 2014.     Past Medical History:  Diagnosis Date  . Acid reflux   . Hypertension     There are no active problems to display for this patient.   History reviewed. No pertinent surgical history.      Home Medications    Prior to Admission medications   Medication Sig Start Date End Date Taking? Authorizing Provider  dicyclomine (BENTYL) 20 MG tablet Take 1 tablet (20 mg total) by mouth 2 (two) times daily. Patient not taking: Reported on 08/21/2015 10/31/12   Helane Rima, MD  esomeprazole (NEXIUM) 40 MG capsule Take 1 capsule (40 mg total) by mouth daily. Patient not taking: Reported on 08/21/2015 10/31/12   Helane Rima, MD  HYDROcodone-acetaminophen (NORCO/VICODIN) 5-325 MG per tablet Take 2 tablets by mouth every 4 (four) hours as needed for pain. Patient not taking: Reported on 08/21/2015 10/31/12   Helane Rima, MD  ibuprofen (ADVIL,MOTRIN) 800 MG tablet Take 1 tablet (800 mg total) by mouth every 8 (eight) hours as needed for mild pain or moderate pain. 06/27/16   Clayton Bibles, PA-C    Family History No family history on file.  Social History Social History    Tobacco Use  . Smoking status: Current Every Day Smoker    Packs/day: 0.50    Types: Cigarettes  . Smokeless tobacco: Never Used  Substance Use Topics  . Alcohol use: No  . Drug use: Yes    Types: Marijuana    Comment: yesterday     Allergies   Patient has no known allergies.   Review of Systems Review of Systems  Constitutional: Negative for activity change.  Musculoskeletal: Positive for arthralgias and joint swelling. Negative for back pain, gait problem and neck pain.  Skin: Negative for wound.  Neurological: Negative for weakness and numbness.     Physical Exam Updated Vital Signs BP (!) 206/113   Pulse 71   Temp 98.6 F (37 C) (Oral)   Resp 20   SpO2 96%   Physical Exam Vitals signs and nursing note reviewed.  Constitutional:      Appearance: He is well-developed.  HENT:     Head: Normocephalic and atraumatic.  Eyes:     Conjunctiva/sclera: Conjunctivae normal.  Neck:     Musculoskeletal: Normal range of motion and neck supple.  Pulmonary:     Effort: No respiratory distress.  Musculoskeletal:     Right ankle: Normal. No tenderness.     Right foot: Decreased range of motion. Tenderness and bony tenderness present.       Feet:  Skin:  General: Skin is warm and dry.  Neurological:     Mental Status: He is alert.      ED Treatments / Results  Labs (all labs ordered are listed, but only abnormal results are displayed) Labs Reviewed - No data to display  EKG None  Radiology Dg Foot Complete Right  Result Date: 08/17/2019 CLINICAL DATA:  Dorsal foot pain, dropped plate on foot. EXAM: RIGHT FOOT COMPLETE - 3+ VIEW COMPARISON:  None FINDINGS: There is no evidence of fracture or dislocation. There is no evidence of arthropathy or other focal bone abnormality. Soft tissues are unremarkable. IMPRESSION: Negative. Electronically Signed   By: Donzetta Kohut M.D.   On: 08/17/2019 13:27    Procedures Procedures (including critical care time)   Medications Ordered in ED Medications - No data to display   Initial Impression / Assessment and Plan / ED Course  I have reviewed the triage vital signs and the nursing notes.  Pertinent labs & imaging results that were available during my care of the patient were reviewed by me and considered in my medical decision making (see chart for details).        Patient seen and examined. Work-up initiated. Medications ordered.   Vital signs reviewed and are as follows: BP (!) 206/113   Pulse 71   Temp 98.6 F (37 C) (Oral)   Resp 20   SpO2 96%   1:43 PM x-ray reviewed and is negative for fracture.  Will give crutches, Ace wrap.  Will start on low-dose chlorthalidone for elevated blood pressures.  Patient will need to follow-up for blood pressure recheck, check of electrolytes and kidney function and blood pressure management.  This was stressed to the patient.  [facility] contact info given.  Discussed rice protocol, pain medications for foot contusion.  Final Clinical Impressions(s) / ED Diagnoses   Final diagnoses:  Contusion of right foot, initial encounter  Hypertension, unspecified type   Contusion of right foot, no signs of compartment syndrome, negative x-rays.  Continue conservative measures.  Home with crutches and Ace wrap.  Hypertension: Incidental.  Likely in part due to pain however patient has had elevated blood pressures dating back at least 6 years.  Will start patient on low-dose blood pressure medication given elevated blood pressure today.  He will need to follow-up for titration.   ED Discharge Orders         Ordered    HYDROcodone-acetaminophen (NORCO/VICODIN) 5-325 MG tablet  Every 6 hours PRN     08/17/19 1341    chlorthalidone (HYGROTON) 25 MG tablet  Daily     08/17/19 1341           Renne Crigler, PA-C 08/17/19 1344    Benjiman Core, MD 08/17/19 1654

## 2019-08-17 NOTE — ED Triage Notes (Addendum)
Patient reports dropping steel plate on right foot today. Movement and sensation present.   Hx HTN. Denies taking blood pressure medications.

## 2019-08-21 ENCOUNTER — Encounter (HOSPITAL_COMMUNITY): Payer: Self-pay | Admitting: Emergency Medicine

## 2019-08-21 ENCOUNTER — Emergency Department (HOSPITAL_COMMUNITY)
Admission: EM | Admit: 2019-08-21 | Discharge: 2019-08-21 | Disposition: A | Discharge status: Home / Self Care | Payer: Self-pay | Attending: Emergency Medicine | Admitting: Emergency Medicine

## 2019-08-21 ENCOUNTER — Emergency Department (HOSPITAL_COMMUNITY): Payer: Self-pay

## 2019-08-21 ENCOUNTER — Other Ambulatory Visit: Payer: Self-pay

## 2019-08-21 DIAGNOSIS — M549 Dorsalgia, unspecified: Secondary | ICD-10-CM | POA: Insufficient documentation

## 2019-08-21 DIAGNOSIS — F1210 Cannabis abuse, uncomplicated: Secondary | ICD-10-CM | POA: Insufficient documentation

## 2019-08-21 DIAGNOSIS — F17210 Nicotine dependence, cigarettes, uncomplicated: Secondary | ICD-10-CM | POA: Insufficient documentation

## 2019-08-21 DIAGNOSIS — R109 Unspecified abdominal pain: Secondary | ICD-10-CM | POA: Insufficient documentation

## 2019-08-21 DIAGNOSIS — I951 Orthostatic hypotension: Secondary | ICD-10-CM | POA: Insufficient documentation

## 2019-08-21 DIAGNOSIS — Z79899 Other long term (current) drug therapy: Secondary | ICD-10-CM | POA: Insufficient documentation

## 2019-08-21 DIAGNOSIS — R079 Chest pain, unspecified: Secondary | ICD-10-CM | POA: Insufficient documentation

## 2019-08-21 DIAGNOSIS — I10 Essential (primary) hypertension: Secondary | ICD-10-CM | POA: Insufficient documentation

## 2019-08-21 LAB — COMPREHENSIVE METABOLIC PANEL
ALT: 20 U/L (ref 0–44)
AST: 19 U/L (ref 15–41)
Albumin: 4.8 g/dL (ref 3.5–5.0)
Alkaline Phosphatase: 84 U/L (ref 38–126)
Anion gap: 14 (ref 5–15)
BUN: 19 mg/dL (ref 6–20)
CO2: 25 mmol/L (ref 22–32)
Calcium: 10.4 mg/dL — ABNORMAL HIGH (ref 8.9–10.3)
Chloride: 95 mmol/L — ABNORMAL LOW (ref 98–111)
Creatinine, Ser: 1.2 mg/dL (ref 0.61–1.24)
GFR calc Af Amer: >60 mL/min (ref >60)
GFR calc non Af Amer: >60 mL/min (ref >60)
Glucose, Bld: 90 mg/dL (ref 70–99)
Potassium: 3.4 mmol/L — ABNORMAL LOW (ref 3.5–5.1)
Sodium: 134 mmol/L — ABNORMAL LOW (ref 135–145)
Total Bilirubin: 0.7 mg/dL (ref 0.3–1.2)
Total Protein: 8.6 g/dL — ABNORMAL HIGH (ref 6.5–8.1)

## 2019-08-21 LAB — CBC
HCT: 56.2 % — ABNORMAL HIGH (ref 39.0–52.0)
Hemoglobin: 19.6 g/dL — ABNORMAL HIGH (ref 13.0–17.0)
MCH: 32.6 pg (ref 26.0–34.0)
MCHC: 34.9 g/dL (ref 30.0–36.0)
MCV: 93.5 fL (ref 80.0–100.0)
Platelets: 223 10*3/uL (ref 150–400)
RBC: 6.01 MIL/uL — ABNORMAL HIGH (ref 4.22–5.81)
RDW: 11.6 % (ref 11.5–15.5)
WBC: 9 10*3/uL (ref 4.0–10.5)
nRBC: 0 % (ref 0.0–0.2)

## 2019-08-21 LAB — I-STAT CHEM 8, ED
BUN: 23 mg/dL — ABNORMAL HIGH (ref 6–20)
Calcium, Ion: 1.19 mmol/L (ref 1.15–1.40)
Chloride: 98 mmol/L (ref 98–111)
Creatinine, Ser: 1.1 mg/dL (ref 0.61–1.24)
Glucose, Bld: 87 mg/dL (ref 70–99)
HCT: 58 % — ABNORMAL HIGH (ref 39.0–52.0)
Hemoglobin: 19.7 g/dL — ABNORMAL HIGH (ref 13.0–17.0)
Potassium: 3.6 mmol/L (ref 3.5–5.1)
Sodium: 136 mmol/L (ref 135–145)
TCO2: 27 mmol/L (ref 22–32)

## 2019-08-21 LAB — LIPASE, BLOOD: Lipase: 35 U/L (ref 11–51)

## 2019-08-21 LAB — DIFFERENTIAL
Abs Immature Granulocytes: 0.02 10*3/uL (ref 0.00–0.07)
Basophils Absolute: 0.1 10*3/uL (ref 0.0–0.1)
Basophils Relative: 1 %
Eosinophils Absolute: 0.1 10*3/uL (ref 0.0–0.5)
Eosinophils Relative: 1 %
Immature Granulocytes: 0 %
Lymphocytes Relative: 41 %
Lymphs Abs: 3.7 10*3/uL (ref 0.7–4.0)
Monocytes Absolute: 0.9 10*3/uL (ref 0.1–1.0)
Monocytes Relative: 10 %
Neutro Abs: 4.2 10*3/uL (ref 1.7–7.7)
Neutrophils Relative %: 47 %

## 2019-08-21 LAB — PROTIME-INR
INR: 1 (ref 0.8–1.2)
Prothrombin Time: 13.2 seconds (ref 11.4–15.2)

## 2019-08-21 LAB — APTT: aPTT: 34 seconds (ref 24–36)

## 2019-08-21 LAB — CBG MONITORING, ED: Glucose-Capillary: 89 mg/dL (ref 70–99)

## 2019-08-21 MED ORDER — HYDRALAZINE HCL 20 MG/ML IJ SOLN
10.0000 mg | Freq: Once | INTRAMUSCULAR | Status: AC
  Administered 2019-08-21: 10 mg via INTRAVENOUS
  Filled 2019-08-21: qty 1

## 2019-08-21 MED ORDER — HYDRALAZINE HCL 20 MG/ML IJ SOLN: 10.0000 mg | Freq: Once | INTRAMUSCULAR | Status: DC

## 2019-08-21 MED ORDER — HYDROMORPHONE HCL 1 MG/ML IJ SOLN: 0.5000 mg | Freq: Once | INTRAMUSCULAR | Status: DC

## 2019-08-21 MED ORDER — IOHEXOL 350 MG/ML SOLN
100.0000 mL | Freq: Once | INTRAVENOUS | Status: AC | PRN
  Administered 2019-08-21: 100 mL via INTRAVENOUS

## 2019-08-21 MED ORDER — POTASSIUM CHLORIDE CRYS ER 20 MEQ PO TBCR
20.0000 meq | EXTENDED_RELEASE_TABLET | Freq: Once | ORAL | Status: AC
  Administered 2019-08-21: 20 meq via ORAL
  Filled 2019-08-21: qty 1

## 2019-08-21 MED ORDER — LABETALOL HCL 5 MG/ML IV SOLN
20.0000 mg | Freq: Once | INTRAVENOUS | Status: AC
  Administered 2019-08-21: 20 mg via INTRAVENOUS
  Filled 2019-08-21: qty 4

## 2019-08-21 MED ORDER — HYDROMORPHONE HCL 1 MG/ML IJ SOLN
1.0000 mg | Freq: Once | INTRAMUSCULAR | Status: AC
  Administered 2019-08-21: 1 mg via INTRAVENOUS
  Filled 2019-08-21: qty 1

## 2019-08-21 MED ORDER — MECLIZINE HCL 25 MG PO TABS
25.0000 mg | ORAL_TABLET | Freq: Once | ORAL | Status: AC
  Administered 2019-08-21: 14:00:00 25 mg via ORAL
  Filled 2019-08-21: qty 1

## 2019-08-21 MED ORDER — SODIUM CHLORIDE 0.9% FLUSH
3.0000 mL | Freq: Once | INTRAVENOUS | Status: AC
Start: 2019-08-21 — End: 2019-08-21
  Administered 2019-08-21: 3 mL via INTRAVENOUS

## 2019-08-21 MED ORDER — LISINOPRIL 20 MG PO TABS: 20.0000 mg | ORAL_TABLET | Freq: Every day | ORAL | 0 refills | Status: DC

## 2019-08-21 MED ORDER — SODIUM CHLORIDE 0.9 % IV BOLUS
1000.0000 mL | Freq: Once | INTRAVENOUS | Status: AC
  Administered 2019-08-21: 1000 mL via INTRAVENOUS

## 2019-08-21 MED ORDER — ONDANSETRON HCL 4 MG/2ML IJ SOLN
4.0000 mg | Freq: Once | INTRAMUSCULAR | Status: AC
  Administered 2019-08-21: 4 mg via INTRAVENOUS
  Filled 2019-08-21: qty 2

## 2019-08-21 NOTE — ED Provider Notes (Signed)
[facility] Provider Note   CSN: [id number] Arrival date & time: 08/21/19  [id number]     History   Chief Complaint Chief Complaint  Patient presents with  . Dizziness    HPI  Todd Allen is a 53 y.o. male with history of hypertension, acid reflux presents today for evaluation of acute onset, intermittent dizziness for 4 days.  He was seen and evaluated at [facility] emergency department on 08/17/2019 after dropping a steel plate on his right foot accidentally.  At that visit he was noted to be significantly hypertensive with blood pressure 206/113.  He has had elevated blood pressures previously but not quite that high so was started on chlorthalidone on discharge.  Reports that he has been taking this medication and started checking his blood pressure which has been around 170 systolic over the last few days.  Unfortunately he has been taking 1.5 tablets of the chlorthalidone though it is prescribed to only take half a tablet daily.  He notes that the day after he was seen on 08/18/2019 he developed feeling of disequilibrium and lightheadedness, difficulty ambulating.  States that this is primarily noticeable with position changes and standing as well as rapid head movements.  He denies vision changes, numbness or weakness of the extremities, difficulty swallowing, headaches, shortness of breath, chest pain, abdominal pain, nausea, vomiting, or fevers.  At times feels as though the dizziness worsens after taking chlorthalidone.  Also notes cramping abdominal pain ""like a charlie horse" which typically occurs with cough or certain rotational movements. He is a current smoker of approximately 6 cigarettes daily, occasionally smokes marijuana, denies alcohol use.  No recent travel or surgeries, no hemoptysis, no prior history of DVT or PE, no hormone replacement therapy.  Denies leg swelling.     The history is provided by the patient.    Past Medical  History:  Diagnosis Date  . Acid reflux   . Hypertension     There are no active problems to display for this patient.   History reviewed. No pertinent surgical history.      Home Medications    Prior to Admission medications   Medication Sig Start Date End Date Taking? Authorizing Provider  chlorthalidone (HYGROTON) 25 MG tablet Take 0.5 tablets (12.5 mg total) by mouth daily. 08/17/19 09/16/19 Yes Renne CriglerGeiple, Joshua, PA-C  HYDROcodone-acetaminophen (NORCO/VICODIN) 5-325 MG tablet Take 1 tablet by mouth every 6 (six) hours as needed for severe pain. 08/17/19   Renne CriglerGeiple, Joshua, PA-C  lisinopril (ZESTRIL) 20 MG tablet Take 1 tablet (20 mg total) by mouth daily. 08/21/19 09/20/19  Arlyn DunningMcLean, Kelly A, PA-C  dicyclomine (BENTYL) 20 MG tablet Take 1 tablet (20 mg total) by mouth 2 (two) times daily. Patient not taking: Reported on 08/21/2015 10/31/12 08/17/19  Mendel RyderSonyika, Chionesu, MD  esomeprazole (NEXIUM) 40 MG capsule Take 1 capsule (40 mg total) by mouth daily. Patient not taking: Reported on 08/21/2015 10/31/12 08/17/19  Mendel RyderSonyika, Chionesu, MD    Family History No family history on file.  Social History Social History   Tobacco Use  . Smoking status: Current Every Day Smoker    Packs/day: 0.50    Types: Cigarettes  . Smokeless tobacco: Never Used  Substance Use Topics  . Alcohol use: No  . Drug use: Yes    Types: Marijuana    Comment: yesterday     Allergies   Patient has no known allergies.   Review of Systems Review of Systems  Constitutional: Negative  for chills and fever.  Eyes: Negative for photophobia and visual disturbance.  Respiratory: Negative for shortness of breath.   Cardiovascular: Negative for chest pain.  Gastrointestinal: Negative for abdominal pain, nausea and vomiting.  Neurological: Positive for dizziness and light-headedness. Negative for syncope, weakness, numbness and headaches.  All other systems reviewed and are negative.    Physical Exam Updated  Vital Signs BP (!) 124/98 (BP Location: Right Arm)   Pulse 89   Temp 98.6 F (37 C) (Oral)   Resp 15   SpO2 100%   Physical Exam Vitals signs and nursing note reviewed.  Constitutional:      General: He is not in acute distress.    Appearance: He is well-developed.  HENT:     Head: Normocephalic and atraumatic.  Eyes:     General:        Right eye: No discharge.        Left eye: No discharge.     Extraocular Movements: Extraocular movements intact.     Conjunctiva/sclera: Conjunctivae normal.     Pupils: Pupils are equal, round, and reactive to light.     Comments: Bilateral right beating nystagmus noted on standing  Neck:     Vascular: No JVD.     Trachea: No tracheal deviation.  Cardiovascular:     Rate and Rhythm: Normal rate and regular rhythm.     Pulses: Normal pulses.     Heart sounds: Normal heart sounds.     Comments: 2+ radial and DP/PT pulses bilaterally, Homans sign absent bilaterally, no lower extremity edema, no palpable cords, compartments are soft  Pulmonary:     Effort: Pulmonary effort is normal.     Breath sounds: Normal breath sounds.  Abdominal:     General: Bowel sounds are normal. There is no distension.     Palpations: Abdomen is soft.     Tenderness: There is no abdominal tenderness. There is no guarding or rebound.     Comments: No pulsatile mass.   Skin:    General: Skin is warm and dry.     Findings: No erythema.  Neurological:     General: No focal deficit present.     Mental Status: He is alert and oriented to person, place, and time.     Cranial Nerves: No cranial nerve deficit.     Sensory: No sensory deficit.     Motor: No weakness.     Coordination: Coordination normal.     Gait: Gait normal.     Comments: Mental Status:  Alert, thought content appropriate, able to give a coherent history. Speech fluent without evidence of aphasia. Able to follow 2 step commands without difficulty.  Cranial Nerves:  II:  Peripheral visual fields  grossly normal, pupils equal, round, reactive to light III,IV, VI: ptosis not present, extra-ocular motions intact bilaterally  V,VII: smile symmetric, facial light touch sensation equal VIII: hearing grossly normal to voice  X: uvula elevates symmetrically  XI: bilateral shoulder shrug symmetric and strong XII: midline tongue extension without fassiculations Motor:  Normal tone. 5/5 strength of BUE and BLE major muscle groups including strong and equal grip strength and dorsiflexion/plantar flexion Sensory: light touch normal in all extremities. Cerebellar: normal finger-to-nose with bilateral upper extremities, Romberg sign absent Gait: normal gait and balance. Able to walk on toes and heels with ease.     Psychiatric:        Behavior: Behavior normal.      ED Treatments / Results  Labs (  all labs ordered are listed, but only abnormal results are displayed) Labs Reviewed  CBC - Abnormal; Notable for the following components:      Result Value   RBC 6.01 (*)    Hemoglobin 19.6 (*)    HCT 56.2 (*)    All other components within normal limits  COMPREHENSIVE METABOLIC PANEL - Abnormal; Notable for the following components:   Sodium 134 (*)    Potassium 3.4 (*)    Chloride 95 (*)    Calcium 10.4 (*)    Total Protein 8.6 (*)    All other components within normal limits  I-STAT CHEM 8, ED - Abnormal; Notable for the following components:   BUN 23 (*)    Hemoglobin 19.7 (*)    HCT 58.0 (*)    All other components within normal limits  DIFFERENTIAL  PROTIME-INR  APTT  LIPASE, BLOOD  CBG MONITORING, ED    EKG EKG Interpretation  Date/Time:  Tuesday August 21 2019 06:52:31 EST Ventricular Rate:  107 PR Interval:  138 QRS Duration: 82 QT Interval:  316 QTC Calculation: 421 R Axis:   70 Text Interpretation: Sinus tachycardia Right atrial enlargement Borderline ECG Since last tracing rate faster Otherwise no significant change Confirmed by [name] [phone]) on  08/21/2019 10:57:49 PM   Radiology Ct Head Wo Contrast  Result Date: 08/21/2019 CLINICAL DATA:  Neuro deficit, subacute possible stroke. Headache and dizziness for 2 days. EXAM: CT HEAD WITHOUT CONTRAST TECHNIQUE: Contiguous axial images were obtained from the base of the skull through the vertex without intravenous contrast. COMPARISON:  CT head without contrast 06/12/2011 FINDINGS: Brain: No acute infarct, hemorrhage, or mass lesion is present. No significant white matter lesions are present. The ventricles are of normal size. The brainstem and cerebellum are within normal limits. Vascular: No hyperdense vessel or unexpected calcification. Skull: Calvarium is intact. No focal lytic or blastic lesions are present. Sinuses/Orbits: The paranasal sinuses and mastoid air cells are clear. The globes and orbits are within normal limits. IMPRESSION: Negative CT of the head. Electronically Signed   By: San Morelle M.D.   On: 08/21/2019 07:19   Ct Angio Chest/abd/pel For Dissection W And/or Wo Contrast  Result Date: 08/21/2019 CLINICAL DATA:  Acute chest and back pain. Hypertension. Aortic dissection suspected. EXAM: CT ANGIOGRAPHY CHEST, ABDOMEN AND PELVIS TECHNIQUE: Multidetector CT imaging through the chest, abdomen and pelvis was performed using the standard protocol during bolus administration of intravenous contrast. Multiplanar reconstructed images and MIPs were obtained and reviewed to evaluate the vascular anatomy. CONTRAST:  145mL OMNIPAQUE IOHEXOL 350 MG/ML SOLN COMPARISON:  No prior chest imaging. Abdominopelvic CT 10/09/2012 FINDINGS: CTA CHEST FINDINGS Cardiovascular: The thoracic aorta is normal in caliber. No dissection, hematoma, or evidence of vasculitis. Common origin of the brachiocephalic and left common carotid artery, normal variant bovine arch anatomy. Aortic branch vessels are patent. There are no filling defects in the central most pulmonary arteries to the lobar level. Heart  is normal in size. No pericardial effusion. Mediastinum/Nodes: No enlarged mediastinal or hilar lymph nodes. Esophagus is decompressed. No visualized thyroid nodule. Lungs/Pleura: The lungs are clear. No focal consolidation. No pulmonary edema or pleural effusion. No pulmonary mass or suspicious nodule. Trachea and mainstem bronchi are patent. Mild apical predominant emphysema. Musculoskeletal: There are no acute or suspicious osseous abnormalities. Review of the MIP images confirms the above findings. CTA ABDOMEN AND PELVIS FINDINGS VASCULAR Aorta: Normal caliber aorta without aneurysm, dissection, vasculitis or significant stenosis. Mild atheromatous plaque about  the distal aorta, primarily noncalcified. Celiac: Patent without evidence of aneurysm, dissection, vasculitis or significant stenosis. SMA: Patent without evidence of aneurysm, dissection, vasculitis or significant stenosis. Renals: Both renal arteries are patent without evidence of aneurysm, dissection, vasculitis, fibromuscular dysplasia or significant stenosis. Small accessory lower pole left renal artery. IMA: Patent without evidence of aneurysm, dissection, vasculitis or significant stenosis. Inflow: Patent without evidence of aneurysm, dissection, vasculitis or significant stenosis. Veins: No obvious venous abnormality within the limitations of this arterial phase study. Review of the MIP images confirms the above findings. NON-VASCULAR Hepatobiliary: No focal liver abnormality is seen. No gallstones, gallbladder wall thickening, or biliary dilatation. Pancreas: No ductal dilatation or inflammation. Slight prominence of the distal pancreatic duct at 2 mm remains within normal limits. Spleen: Normal arterial phase enhancement. Adrenals/Urinary Tract: Subcentimeter left adrenal thickening. Normal right adrenal gland. No hydronephrosis or perinephric edema. Slight lobulated renal contours. Homogeneous renal enhancement. Urinary bladder is  physiologically distended without wall thickening. Stomach/Bowel: Stomach is within normal limits. Appendix appears normal. No evidence of bowel wall thickening, distention, or inflammatory changes. Mild diverticulosis of the descending and sigmoid colon without diverticulitis. Lymphatic: No enlarged lymph nodes in the abdomen or pelvis. Reproductive: Prostate is unremarkable. Other: No ascites or free air. Small fat containing umbilical hernia. Musculoskeletal: There are no acute or suspicious osseous abnormalities. Review of the MIP images confirms the above findings. IMPRESSION: 1. No aortic dissection or acute aortic abnormality. No central pulmonary embolus. 2. No acute abnormality in the chest, abdomen, or pelvis. 3. Minimal emphysema. 4. Mild colonic diverticulosis without diverticulitis. Electronically Signed   By: Narda Rutherford M.D.   On: 08/21/2019 17:14    Procedures .Critical Care Performed by: Jeanie Sewer, PA-C Authorized by: Jeanie Sewer, PA-C   Critical care provider statement:    Critical care time (minutes):  45   Critical care was necessary to treat or prevent imminent or life-threatening deterioration of the following conditions:  Cardiac failure   Critical care was time spent personally by me on the following activities:  Discussions with consultants, evaluation of patient's response to treatment, examination of patient, ordering and performing treatments and interventions, ordering and review of laboratory studies, ordering and review of radiographic studies, pulse oximetry, re-evaluation of patient's condition, obtaining history from patient or surrogate and review of old charts   (including critical care time)  Medications Ordered in ED Medications  sodium chloride flush (NS) 0.9 % injection 3 mL (3 mLs Intravenous Given 08/21/19 1433)  meclizine (ANTIVERT) tablet 25 mg (25 mg Oral Given 08/21/19 1412)  hydrALAZINE (APRESOLINE) injection 10 mg (10 mg Intravenous Given  08/21/19 1432)  sodium chloride 0.9 % bolus 1,000 mL (0 mLs Intravenous Stopped 08/21/19 2003)  potassium chloride SA (KLOR-CON) CR tablet 20 mEq (20 mEq Oral Given 08/21/19 1626)  labetalol (NORMODYNE) injection 20 mg (20 mg Intravenous Given 08/21/19 1632)  HYDROmorphone (DILAUDID) injection 1 mg (1 mg Intravenous Given 08/21/19 1633)  ondansetron (ZOFRAN) injection 4 mg (4 mg Intravenous Given 08/21/19 1642)  iohexol (OMNIPAQUE) 350 MG/ML injection 100 mL (100 mLs Intravenous Contrast Given 08/21/19 1656)  HYDROmorphone (DILAUDID) injection 1 mg (1 mg Intravenous Given 08/21/19 2010)     Initial Impression / Assessment and Plan / ED Course  I have reviewed the triage vital signs and the nursing notes.  Pertinent labs & imaging results that were available during my care of the patient were reviewed by me and considered in my medical decision making (see chart for  details).  Patient presenting for evaluation of intermittent lightheadedness and disequilibrium for the last 4 days.  Symptoms began after starting chlorthalidone which he has been taking 3 times the dose that was recommended accidentally because he missed read the label.  He was started on chlorthalidone after evaluation in the ED for an unrelated complaint but was found to be markedly hypertensive.  In the ED today he is persistently hypertensive.  However resting comfortably in no apparent distress.  No focal neurologic deficits on examination today but he did complain of feeling lightheaded and unsteady when going from a laying position to a standing position.  He was able to ambulate with a steady gait.  Romberg sign absent.  Initial concern was for possible posterior circulation stroke but his symptoms are intermittent and he has no difficulty with ambulating.  Also considered peripheral vertigo, will give meclizine and reassess.  Lab work reviewed by me shows no leukocytosis, elevated hemoglobin and hematocrit which could be  hemoconcentration in the setting of dehydration after taking too much diuretic.  No renal insufficiency, mild hypokalemia, will replenish orally in the ED.  His calcium is minimally elevated.  Head CT shows no acute intracranial abnormalities.   Orthostatics were obtained and he had a drop in his systolic blood pressure though was persistently hypertensive.  His heart rate also went up to 142 bpm with standing.  We will give IV fluid bolus as he is likely dehydrated after taking too much chlorthalidone over the last 3 days.  At this point the patient was noted to be persistently tachycardic even at rest up to the 130s.  He is resting comfortably no apparent distress.  He is low risk for PE, has no leg swelling or complaint of pleuritic chest pain or shortness of breath.  His abdomen is soft and nontender.  He did receive IV hydralazine with little improvement in his blood pressure.  With persistent tachycardia and persistently elevated BP will give a dose of labetalol.   4:00PM RN informed me patient has been experiencing intermittent episodes of severe abdominal pain.  I reassessed the patient, he was mildly tachycardic, still hypertensive.  He is complaining of sharp pain mostly along the midline and radiating to the left side.  He has tenderness to palpation maximally in the epigastrium but some suprapubic and left lower quadrant tenderness as well.  Given persistent hypertension will obtain dissection study for further evaluation.  He received a dose of IV Dilaudid with some improvement.  Signed out care to oncoming provider PA [facility].  Pending CTA and reassessment of his heart rate and blood pressure.  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   Final Clinical Impressions(s) / ED Diagnoses   Final diagnoses:  Intermittent abdominal pain  Orthostasis  Hypertension, unspecified type    ED Discharge Orders         Ordered    lisinopril (ZESTRIL) 20 MG tablet  Daily      08/21/19 2009           Jeanie Sewer, [place] 08/22/19 [id number]    Bethann Berkshire, MD 08/23/19 [phone]

## 2019-08-21 NOTE — Progress Notes (Signed)
Hospitalist Brief Note    53 YO male with Hx of HTN came to the ED with dizziness. PT underwent CT-head head and abdomen and found to be negative for any intracranial pathology or dissection.  Patient's blood work also found to be essentially normal.  However his blood pressure was found to be elevated.  Systolic blood pressure ranged from mid 100s to 683M while diastolic blood pressure was found to be in high 90s to 120s.  Last blood pressure was 179/104.  Patient was given hydralazine 10 mg x 1 dose also given labetalol 20 mg x 1 dose.  Patient was also given meclizine 20.5 mg x 1 and electrolytes were replaced.  Patient was also given some IV fluid bolus.  He was found to be insignificant or no new or acute findings.  Hospitalist service was called for possible admission.   Gone over patient's chart in details.  Also patient's dizziness has resolved. Advise the PA who is taking care of the patient at the moment to give patient another dose of hydralazine IV.  And observe the patient for a couple of hour more.  If blood pressure comes down to acceptable range, advised patient to be discharged home with PCP referral to see in a week or so.  Also advised to give patient a prescription for a second antihypertensive medicine.  All the work-ups that were to be done have already been done in the ED and found to be essentially normal and also there is a still some room to give as needed antihypertensive to monitor and see patient his blood pressure come down to acceptable range. Advised care provider to call the hospital service back if patient's blood pressure does not improve to acceptable range after as needed IV antihypertensive medication given to the patient. Agreed.  Also notified the Furniture conservator/restorer.

## 2019-08-21 NOTE — ED Notes (Signed)
Patient verbalizes understanding of discharge instructions. Opportunity for questioning and answers were provided. Armband removed by staff, pt discharged from ED.  

## 2019-08-21 NOTE — Care Management (Signed)
ED CM spoke with the patient at the bedside. Provided the patient with the contact information for the [facility] at [facility]. Encouraged the patient to call tomorrow and inform the office he was seen in the ED today and referred for an appointment. He verbalizes understanding.

## 2019-08-21 NOTE — Discharge Instructions (Signed)
You were seen today for dizziness, intermittent abdominal pain and elevated blood pressure.  Your work-up included labs, scan of your head, chest, belly which were unremarkable and all reassuring.  Your blood pressure was initially significantly elevated.  This was decreased with some medicines to your IV.  We discussed with the hospitalist whether or not to admit you.  However, since her blood pressure normalized we were able to send you home with close follow-up with primary care and a new prescription for your blood pressure.  Please continue to take the chlorthalidone.  Also start lisinopril.  You may use [employer] for medication coupons until your insurance kicks in.   Thank you for allowing me to care for you today. Please return to the emergency department if you have new or worsening symptoms. Take your medications as instructed.

## 2019-08-21 NOTE — ED Triage Notes (Addendum)
Patient seen at [facility] on 11/6 with history of dizziness.  Patient continues with dizziness, states that he was given a prescription for chlorthalidone and has been taking 1 1/2 tablets daily.  Directions state to take only 1/2 tab daily.  Patient continues with hypertension, denies any slurred speech, no facial droop and equal hand grips.

## 2019-08-21 NOTE — ED Provider Notes (Signed)
  Physical Exam  BP 136/88   Pulse 89   Temp 98.6 F (37 C) (Oral)   Resp 17   SpO2 92%   Physical Exam Vitals signs and nursing note reviewed.  Constitutional:      General: He is not in acute distress.    Appearance: Normal appearance. He is not ill-appearing, toxic-appearing or diaphoretic.     Comments: Appears uncomfortable and in pain  HENT:     Head: Normocephalic and atraumatic.     Mouth/Throat:     Mouth: Mucous membranes are moist.  Eyes:     Conjunctiva/sclera: Conjunctivae normal.  Cardiovascular:     Rate and Rhythm: Normal rate and regular rhythm.  Pulmonary:     Effort: Pulmonary effort is normal.     Breath sounds: Normal breath sounds.  Abdominal:     General: Abdomen is flat.     Tenderness: There is no abdominal tenderness. There is no guarding or rebound.  Skin:    General: Skin is dry.  Neurological:     General: No focal deficit present.     Mental Status: He is alert.  Psychiatric:        Mood and Affect: Mood normal.     ED Course/Procedures   Clinical Course as of Aug 20 2012  Tue Aug 21, 2019  1625 Patient was newly diagnosed with HTN 4days ago and started on chlorthalidone. No PCP. Patient had been overtaking his chlorthalidone. Presented with dizziness which was positional. And had +orthostatics despite being significantly hypertensive. He then began to have paroxysmal belly pain. Plan to get dissection study. Plan to admit for resistant htn if BP does not improve.    [KM]  3382 CTA for dissection study is negative.  Patient's pain is improved with Dilaudid but he still remains hypertensive with diastolic numbers over 505.  I think that given patient's poor follow-up and failure on outpatient hypertensive medications he should be admitted for further work-up.  I discussed this with patient and significant other at bedside and they agree that they would like patient to be admitted.   [KM]  [id number] Spoke with Dr. Tor Netters in consult with this  patient. He reports given the presentation of the patient "If blood pressure comes down to acceptable range, advised patient to be discharged home with PCP referral to see in a week or so.  Also advised to give patient a prescription for a second antihypertensive medicine". See his complete consult note in chart. I have placed a case manager consult for this patient to help arrange follow up and help with medications. Current BP is 148/88   [KM]  2010 Patient remained with very good BP and pain controlled.  Case manager saw the patient and helped with follow-up for blood pressure.  Most recent blood pressure 136/88.  Patient will be started on lisinopril 20 mg daily in addition to the chlorthalidone that he is currently taking.  Advised on return precautions.   [KM]    Clinical Course User Index [KM] Alveria Apley, PA-C    Procedures  MDM  Patient care assumed from [facility] PA due to change of shift.  Patient describes his belly pain as intermittent for months and cramping. No exacerbating or reliving factors. Does not drink alcohol. Normal BM.     Kristine Royal 08/21/19 2013    [name], MD 08/21/19 2258

## 2019-09-11 ENCOUNTER — Inpatient Hospital Stay (INDEPENDENT_AMBULATORY_CARE_PROVIDER_SITE_OTHER): Payer: Self-pay | Admitting: Primary Care

## 2019-09-19 ENCOUNTER — Emergency Department (HOSPITAL_COMMUNITY)
Admission: EM | Admit: 2019-09-19 | Discharge: 2019-09-19 | Disposition: A | Discharge status: Home / Self Care | Payer: 59 | Attending: Emergency Medicine | Admitting: Emergency Medicine

## 2019-09-19 ENCOUNTER — Other Ambulatory Visit: Payer: Self-pay

## 2019-09-19 ENCOUNTER — Emergency Department (HOSPITAL_COMMUNITY): Payer: 59

## 2019-09-19 DIAGNOSIS — M791 Myalgia, unspecified site: Secondary | ICD-10-CM | POA: Insufficient documentation

## 2019-09-19 DIAGNOSIS — F17210 Nicotine dependence, cigarettes, uncomplicated: Secondary | ICD-10-CM | POA: Diagnosis not present

## 2019-09-19 DIAGNOSIS — I10 Essential (primary) hypertension: Secondary | ICD-10-CM | POA: Insufficient documentation

## 2019-09-19 DIAGNOSIS — Z20828 Contact with and (suspected) exposure to other viral communicable diseases: Secondary | ICD-10-CM | POA: Diagnosis not present

## 2019-09-19 DIAGNOSIS — Z79899 Other long term (current) drug therapy: Secondary | ICD-10-CM | POA: Insufficient documentation

## 2019-09-19 LAB — BASIC METABOLIC PANEL
Anion gap: 11 (ref 5–15)
BUN: 12 mg/dL (ref 6–20)
CO2: 27 mmol/L (ref 22–32)
Calcium: 9.3 mg/dL (ref 8.9–10.3)
Chloride: 101 mmol/L (ref 98–111)
Creatinine, Ser: 1.18 mg/dL (ref 0.61–1.24)
GFR calc Af Amer: >60 mL/min (ref >60)
GFR calc non Af Amer: >60 mL/min (ref >60)
Glucose, Bld: 125 mg/dL — ABNORMAL HIGH (ref 70–99)
Potassium: 3.3 mmol/L — ABNORMAL LOW (ref 3.5–5.1)
Sodium: 139 mmol/L (ref 135–145)

## 2019-09-19 LAB — CBC
HCT: 45 % (ref 39.0–52.0)
Hemoglobin: 15.1 g/dL (ref 13.0–17.0)
MCH: 32.2 pg (ref 26.0–34.0)
MCHC: 33.6 g/dL (ref 30.0–36.0)
MCV: 95.9 fL (ref 80.0–100.0)
Platelets: 245 10*3/uL (ref 150–400)
RBC: 4.69 MIL/uL (ref 4.22–5.81)
RDW: 11.8 % (ref 11.5–15.5)
WBC: 7.1 10*3/uL (ref 4.0–10.5)
nRBC: 0 % (ref 0.0–0.2)

## 2019-09-19 LAB — SARS CORONAVIRUS 2 (TAT 6-24 HRS): SARS Coronavirus 2: NEGATIVE

## 2019-09-19 NOTE — ED Provider Notes (Signed)
[facility] Provider [facility]   CSN: [id number] Arrival date & time: 09/19/19  [id number]     History   Chief Complaint Chief Complaint  Patient presents with  . Multiple Complaints    HPI Colon Branchnthony Bernell Allen is a 53 y.o. male.     HPI   He presents for evaluation of generalized myalgia associated with cough.  He has known exposures to Covid through family friends.  He denies weakness, dizziness, shortness of breath, nausea, vomiting, change in bowel or urinary habits.  There are no other known modifying factors.  Past Medical History:  Diagnosis Date  . Acid reflux   . Hypertension     There are no active problems to display for this patient.   No past surgical history on file.      Home Medications    Prior to Admission medications   Medication Sig Start Date End Date Taking? Authorizing Provider  chlorthalidone (HYGROTON) 25 MG tablet Take 0.5 tablets (12.5 mg total) by mouth daily. 08/17/19 09/16/19  Renne CriglerGeiple, Joshua, PA-C  HYDROcodone-acetaminophen (NORCO/VICODIN) 5-325 MG tablet Take 1 tablet by mouth every 6 (six) hours as needed for severe pain. 08/17/19   Renne CriglerGeiple, Joshua, PA-C  lisinopril (ZESTRIL) 20 MG tablet Take 1 tablet (20 mg total) by mouth daily. 08/21/19 09/20/19  Arlyn DunningMcLean, Kelly A, PA-C  dicyclomine (BENTYL) 20 MG tablet Take 1 tablet (20 mg total) by mouth 2 (two) times daily. Patient not taking: Reported on 08/21/2015 10/31/12 08/17/19  Mendel RyderSonyika, Chionesu, MD  esomeprazole (NEXIUM) 40 MG capsule Take 1 capsule (40 mg total) by mouth daily. Patient not taking: Reported on 08/21/2015 10/31/12 08/17/19  Mendel RyderSonyika, Chionesu, MD    Family History No family history on file.  Social History Social History   Tobacco Use  . Smoking status: Current Every Day Smoker    Packs/day: 0.50    Types: Cigarettes  . Smokeless tobacco: Never Used  Substance Use Topics  . Alcohol use: No  . Drug use: Yes    Types: Marijuana    Comment:  yesterday     Allergies   Patient has no known allergies.   Review of Systems Review of Systems  All other systems reviewed and are negative.    Physical Exam Updated Vital Signs BP (!) 156/100 (BP Location: Left Arm)   Pulse 98   Temp 97.9 F (36.6 C) (Oral)   Resp 16   SpO2 98%   Physical Exam Vitals signs and nursing note reviewed.  Constitutional:      Appearance: He is well-developed.  HENT:     Head: Normocephalic and atraumatic.     Right Ear: External ear normal.     Left Ear: External ear normal.     Mouth/Throat:     Mouth: Mucous membranes are moist.     Pharynx: No oropharyngeal exudate or posterior oropharyngeal erythema.  Eyes:     Conjunctiva/sclera: Conjunctivae normal.     Pupils: Pupils are equal, round, and reactive to light.  Neck:     Musculoskeletal: Normal range of motion and neck supple.     Trachea: Phonation normal.  Cardiovascular:     Rate and Rhythm: Normal rate and regular rhythm.     Heart sounds: Normal heart sounds.  Pulmonary:     Effort: Pulmonary effort is normal. No respiratory distress.     Breath sounds: Normal breath sounds. No stridor.  Abdominal:     General: There is no distension.  Palpations: Abdomen is soft.  Musculoskeletal: Normal range of motion.  Skin:    General: Skin is warm and dry.  Neurological:     Mental Status: He is alert and oriented to person, place, and time.     Cranial Nerves: No cranial nerve deficit.     Sensory: No sensory deficit.     Motor: No abnormal muscle tone.     Coordination: Coordination normal.  Psychiatric:        Mood and Affect: Mood normal.        Behavior: Behavior normal.        Thought Content: Thought content normal.        Judgment: Judgment normal.      ED Treatments / Results  Labs (all labs ordered are listed, but only abnormal results are displayed) Labs Reviewed  BASIC METABOLIC PANEL - Abnormal; Notable for the following components:      Result Value    Potassium 3.3 (*)    Glucose, Bld 125 (*)    All other components within normal limits  SARS CORONAVIRUS 2 (TAT 6-24 HRS)  CBC    EKG None  Radiology Dg Chest 2 View  Result Date: 09/19/2019 CLINICAL DATA:  Left rib pain. Dizziness. EXAM: CHEST - 2 VIEW COMPARISON:  Chest CTA 08/21/2019. FINDINGS: The cardiomediastinal contours are normal. The lungs are clear. Pulmonary vasculature is normal. No consolidation, pleural effusion, or pneumothorax. No acute osseous abnormalities are seen. IMPRESSION: No acute chest findings. Electronically Signed   By: Keith Rake M.D.   On: 09/19/2019 06:20    Procedures Procedures (including critical care time)  Medications Ordered in ED Medications - No data to display   Initial Impression / Assessment and Plan / ED Course  I have reviewed the triage vital signs and the nursing notes.  Pertinent labs & imaging results that were available during my care of the patient were reviewed by me and considered in my medical decision making (see chart for details).  Clinical Course as of Sep 18 828  Wed Sep 19, 2019  0828 Normal  CBC [EW]  0828 Normal except potassium low, glucose high  Basic metabolic panel(!) [EW]  7893 No infiltrate or CHF, images interpreted by me   [EW]    Clinical Course User Index [EW] [name], MD        Patient Vitals for the past 24 hrs:  BP Temp Temp src Pulse Resp SpO2  09/19/19 0557 (!) 156/100 97.9 F (36.6 C) Oral 98 16 98 %    8:29 AM Reevaluation with update and discussion. After initial assessment and treatment, an updated evaluation reveals no change in clinical status, findings discussed with the patient and all questions were answered. [name]   Medical Decision Making: Nonspecific myalgias with cough.  Relatively high suspicion for COVID-19 infection, without significant compromise.  Could also have influenza.  Doubt pneumonia, ACS, metabolic instability or serious bacterial  infection.  Todd Allen was evaluated in Emergency Department on 09/19/2019 for the symptoms described in the history of present illness. He was evaluated in the context of the global COVID-19 pandemic, which necessitated consideration that the patient might be at risk for infection with the SARS-CoV-2 virus that causes COVID-19. Institutional protocols and algorithms that pertain to the evaluation of patients at risk for COVID-19 are in a state of rapid change based on information released by regulatory bodies including the CDC and federal and state organizations. These policies and algorithms were followed during  the patient's care in the ED.   CRITICAL CARE-no Performed by: Mancel Bale  Nursing Notes Reviewed/ Care Coordinated Applicable Imaging Reviewed Interpretation of Laboratory Data incorporated into ED treatment  The patient appears reasonably screened and/or stabilized for discharge and I doubt any other medical condition or other [facility] requiring further screening, evaluation, or treatment in the ED at this time prior to discharge.  Plan: Home Medications-OTC as needed; Home Treatments-rest, fluids, quarantine until Covid result returns; return here if the recommended treatment, does not improve the symptoms; Recommended follow up-PCP of choice for blood pressure check in 1 or 2 weeks.   Final Clinical Impressions(s) / ED Diagnoses   Final diagnoses:  Myalgia    ED Discharge Orders    None       Mancel Bale, MD 09/19/19 [phone]

## 2019-09-19 NOTE — ED Notes (Signed)
See MD assessment. 

## 2019-09-19 NOTE — ED Triage Notes (Addendum)
Pt c/o dizziness, no BM since Friday (4 days), states his work won't let him come back because he "looks so bad", and he has a pain when he inhales deep (left rib cage).

## 2019-09-19 NOTE — Discharge Instructions (Signed)
Your testing did not show any serious problems today.  We have sent a test for Covid infection.  Is important to avoid all people, and mask, wherever you go until the result returns.  For your discomfort, use Tylenol or Motrin for pain or fever.  For cough use Robitussin-DM.  Your blood pressure was a little bit elevated today so make sure you continue to take your blood pressure medicine.  Follow-up with a primary care doctor for recheck of your blood pressure in 2 or 3 weeks.

## 2019-11-12 ENCOUNTER — Emergency Department (HOSPITAL_COMMUNITY)
Admission: EM | Admit: 2019-11-12 | Discharge: 2019-11-12 | Disposition: A | Discharge status: Home / Self Care | Payer: 59 | Attending: Emergency Medicine | Admitting: Emergency Medicine

## 2019-11-12 ENCOUNTER — Emergency Department (HOSPITAL_COMMUNITY): Payer: 59

## 2019-11-12 ENCOUNTER — Encounter (HOSPITAL_COMMUNITY): Payer: Self-pay | Admitting: Emergency Medicine

## 2019-11-12 DIAGNOSIS — F17210 Nicotine dependence, cigarettes, uncomplicated: Secondary | ICD-10-CM | POA: Insufficient documentation

## 2019-11-12 DIAGNOSIS — K219 Gastro-esophageal reflux disease without esophagitis: Secondary | ICD-10-CM | POA: Diagnosis not present

## 2019-11-12 DIAGNOSIS — Z720 Tobacco use: Secondary | ICD-10-CM

## 2019-11-12 DIAGNOSIS — Z79899 Other long term (current) drug therapy: Secondary | ICD-10-CM | POA: Insufficient documentation

## 2019-11-12 DIAGNOSIS — R0789 Other chest pain: Secondary | ICD-10-CM

## 2019-11-12 DIAGNOSIS — I10 Essential (primary) hypertension: Secondary | ICD-10-CM | POA: Diagnosis not present

## 2019-11-12 LAB — TROPONIN I (HIGH SENSITIVITY)
Troponin I (High Sensitivity): <2 ng/L (ref <18)
Troponin I (High Sensitivity): <2 ng/L (ref <18)

## 2019-11-12 LAB — BASIC METABOLIC PANEL
Anion gap: 11 (ref 5–15)
BUN: 8 mg/dL (ref 6–20)
CO2: 26 mmol/L (ref 22–32)
Calcium: 9.7 mg/dL (ref 8.9–10.3)
Chloride: 102 mmol/L (ref 98–111)
Creatinine, Ser: 1.18 mg/dL (ref 0.61–1.24)
GFR calc Af Amer: >60 mL/min (ref >60)
GFR calc non Af Amer: >60 mL/min (ref >60)
Glucose, Bld: 87 mg/dL (ref 70–99)
Potassium: 3.7 mmol/L (ref 3.5–5.1)
Sodium: 139 mmol/L (ref 135–145)

## 2019-11-12 LAB — CBC
HCT: 49.5 % (ref 39.0–52.0)
Hemoglobin: 16.9 g/dL (ref 13.0–17.0)
MCH: 32.8 pg (ref 26.0–34.0)
MCHC: 34.1 g/dL (ref 30.0–36.0)
MCV: 95.9 fL (ref 80.0–100.0)
Platelets: 203 10*3/uL (ref 150–400)
RBC: 5.16 MIL/uL (ref 4.22–5.81)
RDW: 12 % (ref 11.5–15.5)
WBC: 8.9 10*3/uL (ref 4.0–10.5)
nRBC: 0 % (ref 0.0–0.2)

## 2019-11-12 MED ORDER — KETOROLAC TROMETHAMINE 30 MG/ML IJ SOLN
30.0000 mg | Freq: Once | INTRAMUSCULAR | Status: AC
  Administered 2019-11-12: 30 mg via INTRAMUSCULAR
  Filled 2019-11-12: qty 1

## 2019-11-12 MED ORDER — FAMOTIDINE 20 MG PO TABS: 20.0000 mg | ORAL_TABLET | Freq: Two times a day (BID) | ORAL | 0 refills | Status: DC

## 2019-11-12 MED ORDER — ALUM & MAG HYDROXIDE-SIMETH 200-200-20 MG/5ML PO SUSP
30.0000 mL | Freq: Once | ORAL | Status: AC
  Administered 2019-11-12: 30 mL via ORAL
  Filled 2019-11-12: qty 30

## 2019-11-12 MED ORDER — SODIUM CHLORIDE 0.9% FLUSH: 3.0000 mL | Freq: Once | INTRAVENOUS | Status: DC

## 2019-11-12 MED ORDER — LIDOCAINE VISCOUS HCL 2 % MT SOLN
15.0000 mL | Freq: Once | OROMUCOSAL | Status: AC
  Administered 2019-11-12: 15 mL via ORAL
  Filled 2019-11-12: qty 15

## 2019-11-12 NOTE — Discharge Instructions (Addendum)
You need to stop smoking.

## 2019-11-12 NOTE — ED Notes (Signed)
Pt discharge and prescription education provided. Pt verbalizes understanding. Pt is alert and oriented x 4 and ambulatory at discharge.  

## 2019-11-12 NOTE — ED Provider Notes (Signed)
[facility] EMERGENCY DEPARTMENT Provider Note   CSN: [id number] Arrival date & time: 11/12/19  1533     History Chief Complaint  Patient presents with  . Chest Pain    Todd Allen is a 54 y.o. male.  Pt presents to the ED today with cp.  The pt said he's had periodic sharp pains to his chest for the past week.  He feels it on the left side of his chest.  The pt said nothing makes it worse or better.  He does have htn and smokes.  He denies any sob or n/v.  No fevers/chills.  No known covid exposures.  Pt said his wife was concerned, so he came in to get it checked.        Past Medical History:  Diagnosis Date  . Acid reflux   . Hypertension     There are no problems to display for this patient.   No past surgical history on file.     No family history on file.  Social History   Tobacco Use  . Smoking status: Current Every Day Smoker    Packs/day: 0.50    Types: Cigarettes  . Smokeless tobacco: Never Used  Substance Use Topics  . Alcohol use: No  . Drug use: Yes    Types: Marijuana    Comment: yesterday    Home Medications Prior to Admission medications   Medication Sig Start Date End Date Taking? Authorizing Provider  chlorthalidone (HYGROTON) 25 MG tablet Take 0.5 tablets (12.5 mg total) by mouth daily. 08/17/19 09/16/19  Renne Crigler, PA-C  famotidine (PEPCID) 20 MG tablet Take 1 tablet (20 mg total) by mouth 2 (two) times daily. 11/12/19   Jacalyn Lefevre, MD  HYDROcodone-acetaminophen (NORCO/VICODIN) 5-325 MG tablet Take 1 tablet by mouth every 6 (six) hours as needed for severe pain. 08/17/19   Renne Crigler, PA-C  lisinopril (ZESTRIL) 20 MG tablet Take 1 tablet (20 mg total) by mouth daily. 08/21/19 09/20/19  Arlyn Dunning, PA-C  dicyclomine (BENTYL) 20 MG tablet Take 1 tablet (20 mg total) by mouth 2 (two) times daily. Patient not taking: Reported on 08/21/2015 10/31/12 08/17/19  Mendel Ryder, MD  esomeprazole (NEXIUM)  40 MG capsule Take 1 capsule (40 mg total) by mouth daily. Patient not taking: Reported on 08/21/2015 10/31/12 08/17/19  Mendel Ryder, MD    Allergies    Patient has no known allergies.  Review of Systems   Review of Systems  Cardiovascular: Positive for chest pain.  All other systems reviewed and are negative.   Physical Exam Updated Vital Signs BP 140/86   Pulse 74   Temp 98.3 F (36.8 C) (Oral)   Resp 17   SpO2 100%   Physical Exam Vitals and nursing note reviewed.  Constitutional:      Appearance: He is well-developed.  HENT:     Head: Normocephalic and atraumatic.  Eyes:     Extraocular Movements: Extraocular movements intact.     Pupils: Pupils are equal, round, and reactive to light.  Cardiovascular:     Rate and Rhythm: Normal rate and regular rhythm.     Heart sounds: Normal heart sounds.  Pulmonary:     Effort: Pulmonary effort is normal.     Breath sounds: Normal breath sounds.  Abdominal:     General: Bowel sounds are normal.     Palpations: Abdomen is soft.  Musculoskeletal:        General: Normal range of motion.  Cervical back: Normal range of motion and neck supple.  Skin:    General: Skin is warm.     Capillary Refill: Capillary refill takes less than 2 seconds.  Neurological:     General: No focal deficit present.     Mental Status: He is alert and oriented to person, place, and time.  Psychiatric:        Mood and Affect: Mood normal.        Behavior: Behavior normal.     ED Results / Procedures / Treatments   Labs (all labs ordered are listed, but only abnormal results are displayed) Labs Reviewed  BASIC METABOLIC PANEL  CBC  TROPONIN I (HIGH SENSITIVITY)  TROPONIN I (HIGH SENSITIVITY)    EKG EKG Interpretation  Date/Time:  Monday November 12 2019 15:41:34 EST Ventricular Rate:  75 PR Interval:  136 QRS Duration: 84 QT Interval:  344 QTC Calculation: 384 R Axis:   72 Text Interpretation: Normal sinus rhythm  Nonspecific T wave changes inferiorly No STEMI Confirmed by Nanda Quinton [phone]) on 11/12/2019 3:50:08 PM   Radiology DG Chest 2 View  Result Date: 11/12/2019 CLINICAL DATA:  Chest pain, sharp pain to the left side of the chest EXAM: CHEST - 2 VIEW COMPARISON:  09/19/2019 FINDINGS: Cardiomediastinal contours are normal. Pulmonary vasculature and hilar structures are unremarkable. Lungs are clear. No signs of pleural effusion. Visualized skeletal structures are unremarkable. IMPRESSION: No active cardiopulmonary disease. Electronically Signed   By: Zetta Bills M.D.   On: 11/12/2019 16:16    Procedures Procedures (including critical care time)  Medications Ordered in ED Medications  sodium chloride flush (NS) 0.9 % injection 3 mL (has no administration in time range)  ketorolac (TORADOL) 30 MG/ML injection 30 mg (30 mg Intramuscular Given 11/12/19 2147)  alum & mag hydroxide-simeth (MAALOX/MYLANTA) 200-200-20 MG/5ML suspension 30 mL (30 mLs Oral Given 11/12/19 2147)    And  lidocaine (XYLOCAINE) 2 % viscous mouth solution 15 mL (15 mLs Oral Given 11/12/19 2147)    ED Course  I have reviewed the triage vital signs and the nursing notes.  Pertinent labs & imaging results that were available during my care of the patient were reviewed by me and considered in my medical decision making (see chart for details).    MDM Rules/Calculators/A&P                       Heart score:  2 with 2 neg troponins.  Pt said he is getting established with [facility] for a pcp.  He has enough bp medication to last him another month.  He is encouraged to stop smoking.  Pt has a hx of GERD, but is not taking any current meds for it.  I will put him on pepcid and have him return if worse.  Final Clinical Impression(s) / ED Diagnoses Final diagnoses:  Atypical chest pain  Tobacco abuse  Gastroesophageal reflux disease, unspecified whether esophagitis present    Rx / DC Orders ED Discharge  Orders         Ordered    famotidine (PEPCID) 20 MG tablet  2 times daily     11/12/19 2317           Isla Pence, MD 11/12/19 2320

## 2019-11-12 NOTE — ED Triage Notes (Signed)
Pt arrives to ED with c/c of CP x 1 week pt states the pain is on the left side and seems to comes and go. denies any sob, fever or sob. No syncope or dizziness.

## 2020-06-30 ENCOUNTER — Ambulatory Visit: Payer: Self-pay | Admitting: Student

## 2020-06-30 ENCOUNTER — Other Ambulatory Visit: Payer: Self-pay

## 2020-06-30 ENCOUNTER — Other Ambulatory Visit: Payer: Self-pay | Admitting: Student

## 2020-06-30 ENCOUNTER — Encounter: Payer: Self-pay | Admitting: Student

## 2020-06-30 VITALS — BP 178/90 | HR 83 | Temp 98.4°F | Ht 66.0 in | Wt 156.9 lb

## 2020-06-30 DIAGNOSIS — I10 Essential (primary) hypertension: Secondary | ICD-10-CM

## 2020-06-30 DIAGNOSIS — Z23 Encounter for immunization: Secondary | ICD-10-CM

## 2020-06-30 DIAGNOSIS — Z599 Problem related to housing and economic circumstances, unspecified: Secondary | ICD-10-CM

## 2020-06-30 DIAGNOSIS — Z720 Tobacco use: Secondary | ICD-10-CM | POA: Insufficient documentation

## 2020-06-30 DIAGNOSIS — F17210 Nicotine dependence, cigarettes, uncomplicated: Secondary | ICD-10-CM

## 2020-06-30 MED ORDER — LISINOPRIL-HYDROCHLOROTHIAZIDE 10-12.5 MG PO TABS: 1.0000 | ORAL_TABLET | Freq: Every day | ORAL | 11 refills | Status: DC

## 2020-06-30 MED FILL — LISINOPRIL-HCTZ 10-12.5 MG: 10-12.5 | 30 days supply | Qty: 30 | Fill #0

## 2020-06-30 NOTE — Assessment & Plan Note (Signed)
Patient reports prior tobacco use of 1.5 to 2 packs per day. He has cut down to about a half pack per day. He is interested in quitting entirely. He is not interested in nicotine gum, but has an interest in lozenges or nicotine patches. He denies frequent cough, shortness of breath. His lungs sound clear to auscultation bilaterally on examination. -Provided pamphlet for ability to access free nicotine lozenges/patches -Encouraged tobacco cessation

## 2020-06-30 NOTE — Assessment & Plan Note (Signed)
Patient is due for influenza vaccination. -Provided influenza vaccine

## 2020-06-30 NOTE — Assessment & Plan Note (Signed)
Patient presents to clinic today with blood pressure of 178/90. He is not currently taking any antihypertensives, however based on chart review, he has been prescribed short courses of chlorthalidone and lisinopril upon discharge from the emergency department on multiple occassions. He reports having occasional light headedness and dizziness following initiation of these medications. -Start HCTZ-lisinopril (12.5mg -10mg ) -Obtain BMP -Recheck blood pressure in two weeks -Consider increasing HCTZ-lisinopril to 25mg -10mg  pending labwork and blood pressure re-check

## 2020-06-30 NOTE — Patient Instructions (Addendum)
Todd Allen,   It was a pleasure meeting you today in clinic. Due to your blood pressure being elevated today and in the past when you went to the emergency department, we will need to start you on a medication to lower your blood pressure. We have prescribed a pill that is a combination of two common medications (lisinopril and HCTZ) which will work to lower your blood pressure. We will also collect a basic metabolic panel today to check on your kidney function and electrolytes. Depending on the results of your labs and your blood pressure at your next clinic visit, we may increase the dosage of this medication at your next visit.  Sincerely, Dr. Despina Hidden, MD

## 2020-06-30 NOTE — Progress Notes (Signed)
   CC: New patient visit, establish care  HPI:  Todd Allen is a 54 y.o. with past medical history of HTN and GERD who presents as a new patient visit to establish care. Please, refer to Problem List for Assessment/Plan based charting of this encounter.  Past Medical History: Hypertension  Past Surgical History: None  Medications: No prescription medications. Occassional tylenol or ibuprofen  Allergies: None  Family History: Father - unknown Mother, deceased - HTN Brother - OSA   Social History: Tobacco use - 1/2 pack per day. Used to smoke 1.5-2 packs per day. "Wish I could quit." Alcohol use - Don't drink Recreational drug use - Marijuana every now and then, on weekends Has three sons and one daughter. Lives in [place] with uncle. Works with [employer].   Review of Systems:  Review of Systems  Constitutional: Negative for chills and fever.  HENT: Negative for hearing loss and sore throat.   Respiratory: Negative for cough and shortness of breath.   Cardiovascular: Negative for chest pain and leg swelling.  Gastrointestinal: Negative for diarrhea, nausea and vomiting.  Genitourinary: Negative for dysuria and hematuria.  Musculoskeletal: Positive for joint pain.  Skin: Negative for itching and rash.  Neurological: Negative for loss of consciousness and weakness.   Physical Exam:  Vitals:   06/30/20 1429  BP: (!) 178/90  Pulse: 83  Temp: 98.4 F (36.9 C)  TempSrc: Oral  SpO2: 100%  Weight: 156 lb 14.4 oz (71.2 kg)   Physical Exam Constitutional:      Appearance: Normal appearance.  HENT:     Head: Normocephalic and atraumatic.     Mouth/Throat:     Mouth: Mucous membranes are moist.     Pharynx: Oropharynx is clear.  Eyes:     Extraocular Movements: Extraocular movements intact.     Conjunctiva/sclera: Conjunctivae normal.  Cardiovascular:     Rate and Rhythm: Normal rate and regular rhythm.     Pulses: Normal pulses.     Heart sounds:  Normal heart sounds.  Pulmonary:     Effort: Pulmonary effort is normal.     Breath sounds: Normal breath sounds.  Abdominal:     General: Abdomen is flat. Bowel sounds are normal.     Palpations: Abdomen is soft.     Tenderness: There is no abdominal tenderness.  Musculoskeletal:        General: Normal range of motion.     Cervical back: Normal range of motion and neck supple.  Skin:    General: Skin is warm and dry.     Capillary Refill: Capillary refill takes less than 2 seconds.  Neurological:     General: No focal deficit present.     Mental Status: He is alert. Mental status is at baseline.  Psychiatric:        Mood and Affect: Mood normal.        Behavior: Behavior normal.        Thought Content: Thought content normal.        Judgment: Judgment normal.    Assessment & Plan:   See Encounters Tab for problem based charting.  Patient seen with Dr. Mikey Bussing

## 2020-06-30 NOTE — Assessment & Plan Note (Signed)
Patient has financial difficulties with no health insurance. This will need to be considered when prescribing medications and obtaining laboratory work. -Obtain orange card

## 2020-07-01 ENCOUNTER — Telehealth: Payer: Self-pay

## 2020-07-01 LAB — BMP8+ANION GAP
Anion Gap: 15 mmol/L (ref 10.0–18.0)
BUN/Creatinine Ratio: 9 (ref 9–20)
BUN: 10 mg/dL (ref 6–24)
CO2: 24 mmol/L (ref 20–29)
Calcium: 9.4 mg/dL (ref 8.7–10.2)
Chloride: 102 mmol/L (ref 96–106)
Creatinine, Ser: 1.08 mg/dL (ref 0.76–1.27)
GFR calc Af Amer: 91 mL/min/{1.73_m2} (ref >59)
GFR calc non Af Amer: 78 mL/min/{1.73_m2} (ref >59)
Glucose: 85 mg/dL (ref 65–99)
Potassium: 4.2 mmol/L (ref 3.5–5.2)
Sodium: 141 mmol/L (ref 134–144)

## 2020-07-01 NOTE — Telephone Encounter (Signed)
Attempted to call patient x1 this morning without answer. Contacted patient by telephone successfully at 1:00PM 07/01/20 and discussed patient's test results.

## 2020-07-01 NOTE — Telephone Encounter (Signed)
Requesting test results, please call pt back.  

## 2020-07-14 ENCOUNTER — Ambulatory Visit: Payer: Self-pay

## 2020-07-14 ENCOUNTER — Encounter: Payer: Self-pay | Admitting: Internal Medicine

## 2020-07-23 NOTE — Progress Notes (Signed)
Internal Medicine Clinic Attending  I saw and evaluated the patient.  I personally confirmed the key portions of the history and exam documented by Dr. Johnson and I reviewed pertinent patient test results.  The assessment, diagnosis, and plan were formulated together and I agree with the documentation in the resident's note.  

## 2020-10-14 ENCOUNTER — Ambulatory Visit: Payer: Self-pay | Admitting: *Deleted

## 2020-10-14 NOTE — Telephone Encounter (Signed)
I returned pt's call.  He is c/o having had a "head cold".   His covid test was negative.  He is still having night sweats and body aches.  He does not have fever or coughing now.  He did have diarrhea that has almost resolved.  His main reason for calling was to see if he should stay home from work or not.   I let him know that he would need to check with his employer and see what their policy is regarding illness and staying out of work especially since his covid test was negative.    He thanked me for returning his call and for my help.  He does not have a PCP per pt.   I instructed him to go to the urgent care if he doesn't feel he is getting better or his symptoms become worse.    Reason for Disposition . Common cold with no complications  Answer Assessment - Initial Assessment Questions 1. ONSET: "When did the nasal discharge start?"      My covid is negative.   I'm getting sweats at night and chills.  My was 31 a couple of days ago but it's fine now.   Last Thursday I started having symptoms.   2. AMOUNT: "How much discharge is there?"      My body was aching but not now.   3. COUGH: "Do you have a cough?" If yes, ask: "Describe the color of your sputum" (clear, white, yellow, green)     I was coughing but much better now.   Maybe 5 coughs a day. 4. RESPIRATORY DISTRESS: "Describe your breathing."      No shortness of breath or chest pain. 5. FEVER: "Do you have a fever?" If Yes, ask: "What is your temperature, how was it measured, and when did it start?"     Not now. 6. SEVERITY: "Overall, how bad are you feeling right now?" (e.g., doesn't interfere with normal activities, staying home from school/work, staying in bed)      I'm much better but the body aches and night sweats are still there. 7. OTHER SYMPTOMS: "Do you have any other symptoms?" (e.g., sore throat, earache, wheezing, vomiting)     I had diarrhea and vomited once several days ago.   The diarrhea is much better now.     I just wanted to know if I should stay home from work or not.    8. PREGNANCY: "Is there any chance you are pregnant?" "When was your last menstrual period?"     N/A  Protocols used: COMMON COLD-A-AH

## 2020-11-24 ENCOUNTER — Telehealth: Payer: Self-pay

## 2020-11-24 MED FILL — LISINOPRIL-HCTZ 10-12.5 MG: 10-12.5 | 30 days supply | Qty: 30 | Fill #1

## 2020-11-24 NOTE — Telephone Encounter (Signed)
Rx for Zestoretic still had 11 refills. Erin at [facility] will modify Rx for 2 refills after this one. She will also put note on Rx to call our clinic to schedule appt. Kinnie Feil, BSN, RN-BC

## 2020-11-24 NOTE — Telephone Encounter (Signed)
  lisinopril-hydrochlorothiazide (ZESTORETIC) 10-12.5 MG tablet, REFILL REQUEST @  [facility] - [place], [place] - [address] [facility]. Phone:  [phone]  Fax:  [phone]

## 2020-11-24 NOTE — Telephone Encounter (Signed)
Agree with plan for patient to pick-up prescribed antihypertensive at [facility]. If able to get in contact with patient, he would benefit from scheduling follow-up appointment.

## 2020-11-24 NOTE — Telephone Encounter (Signed)
#  30 with 11 refills sent to [facility] on 06/30/2020. Attempted to notify patient to contact Pharmacy for refill, however, patient's number states it is no longer in service. Spoke with Jasmine at [facility]. States patient has refills but has not picked one up in 4 months. Will route to Red Team to make them aware. Kinnie Feil, BSN, RN-BC '

## 2021-03-15 ENCOUNTER — Encounter: Payer: Self-pay | Admitting: *Deleted

## 2022-02-08 ENCOUNTER — Emergency Department (HOSPITAL_BASED_OUTPATIENT_CLINIC_OR_DEPARTMENT_OTHER)
Admission: EM | Admit: 2022-02-08 | Discharge: 2022-02-08 | Disposition: A | Discharge status: Home / Self Care | Payer: Self-pay | Attending: Emergency Medicine | Admitting: Emergency Medicine

## 2022-02-08 ENCOUNTER — Other Ambulatory Visit (HOSPITAL_BASED_OUTPATIENT_CLINIC_OR_DEPARTMENT_OTHER): Payer: Self-pay

## 2022-02-08 ENCOUNTER — Encounter (HOSPITAL_BASED_OUTPATIENT_CLINIC_OR_DEPARTMENT_OTHER): Payer: Self-pay

## 2022-02-08 ENCOUNTER — Other Ambulatory Visit: Payer: Self-pay

## 2022-02-08 DIAGNOSIS — I10 Essential (primary) hypertension: Secondary | ICD-10-CM | POA: Insufficient documentation

## 2022-02-08 DIAGNOSIS — I160 Hypertensive urgency: Secondary | ICD-10-CM | POA: Insufficient documentation

## 2022-02-08 DIAGNOSIS — Z79899 Other long term (current) drug therapy: Secondary | ICD-10-CM | POA: Insufficient documentation

## 2022-02-08 LAB — BASIC METABOLIC PANEL
Anion gap: 6 (ref 5–15)
BUN: 11 mg/dL (ref 6–20)
CO2: 26 mmol/L (ref 22–32)
Calcium: 9.3 mg/dL (ref 8.9–10.3)
Chloride: 107 mmol/L (ref 98–111)
Creatinine, Ser: 0.95 mg/dL (ref 0.61–1.24)
GFR, Estimated: >60 mL/min (ref >60)
Glucose, Bld: 97 mg/dL (ref 70–99)
Potassium: 3.7 mmol/L (ref 3.5–5.1)
Sodium: 139 mmol/L (ref 135–145)

## 2022-02-08 LAB — CBC
HCT: 44.6 % (ref 39.0–52.0)
Hemoglobin: 15.4 g/dL (ref 13.0–17.0)
MCH: 32.7 pg (ref 26.0–34.0)
MCHC: 34.5 g/dL (ref 30.0–36.0)
MCV: 94.7 fL (ref 80.0–100.0)
Platelets: 178 10*3/uL (ref 150–400)
RBC: 4.71 MIL/uL (ref 4.22–5.81)
RDW: 12 % (ref 11.5–15.5)
WBC: 7.2 10*3/uL (ref 4.0–10.5)
nRBC: 0 % (ref 0.0–0.2)

## 2022-02-08 LAB — CBG MONITORING, ED: Glucose-Capillary: 95 mg/dL (ref 70–99)

## 2022-02-08 MED ORDER — LISINOPRIL-HYDROCHLOROTHIAZIDE 10-12.5 MG PO TABS
1.0000 | ORAL_TABLET | Freq: Every day | ORAL | 0 refills | Status: DC
  Filled 2022-02-08: qty 30, 30d supply, fill #0

## 2022-02-08 MED ORDER — LISINOPRIL 10 MG PO TABS
10.0000 mg | ORAL_TABLET | Freq: Once | ORAL | Status: AC
  Administered 2022-02-08: 10 mg via ORAL
  Filled 2022-02-08: qty 1

## 2022-02-08 NOTE — ED Triage Notes (Signed)
Complaining of feeling dizzy this am, said the last time he felt like this his blood pressure was up. That was 3 weeks ago. ?

## 2022-02-08 NOTE — ED Provider Notes (Signed)
?[facility] EMERGENCY DEPARTMENT ?Provider Note ? ? ?CSN: [id number] ?Arrival date & time: 02/08/22  0555 ?  ? ?History ? ?Chief Complaint  ?Patient presents with  ? Dizziness  ? ? ?Todd Allen is a 56 y.o. male with PMH of hypertension who presents to the ED with dizziness that started one day ago. The patient states that he was in his usual state of health until yesterday when he began to feel as if the room was spinning and that he may pass out. He does not recall what he was doing when his symptoms first started and notes that his symptoms have been intermittent and recurring over the last day. Now, the patient states that his dizziness feels improved but he endorses having a headache associated with blurry vision. He denies any chest pain, palpitations, n/v/d, melena, hematochezia, dysuria, or hematuria. Of note, the patient's BP was elevated to 222/96 upon arrival to the ED. He was given one dose of lisinopril 10 mg and BP was 193/97 by the time I evaluated the patient. The patient is supposed to be on lisinopril 10 mg daily at home, however, he has not taken this in months as he does not have a PCP and has been out of this medication.  ? ?The history is provided by the patient and a friend. No language interpreter was used.  ?Dizziness ?Quality:  Lightheadedness and room spinning ?Severity:  Moderate ?Onset quality:  Sudden ?Duration:  1 day ?Timing:  Intermittent ?Progression:  Waxing and waning ?Chronicity:  New ?Context: not with loss of consciousness   ?Relieved by:  Closing eyes ?Ineffective treatments:  None tried ?Associated symptoms: headaches and vision changes   ?Associated symptoms: no blood in stool, no chest pain, no diarrhea, no hearing loss, no nausea, no palpitations, no shortness of breath, no syncope, no tinnitus, no vomiting and no weakness   ? ?  ? ?Home Medications ?Prior to Admission medications   ?Medication Sig Start Date End Date Taking? Authorizing Provider   ?lisinopril-hydrochlorothiazide (ZESTORETIC) 10-12.5 MG tablet TAKE 1 TABLET BY MOUTH DAILY. 06/30/20 06/30/21  Roylene Reason, MD  ?dicyclomine (BENTYL) 20 MG tablet Take 1 tablet (20 mg total) by mouth 2 (two) times daily. ?Patient not taking: Reported on 08/21/2015 10/31/12 08/17/19  Mendel Ryder, MD  ?esomeprazole (NEXIUM) 40 MG capsule Take 1 capsule (40 mg total) by mouth daily. ?Patient not taking: Reported on 08/21/2015 10/31/12 08/17/19  Mendel Ryder, MD  ?   ? ?Allergies    ?Patient has no known allergies.   ? ?Review of Systems   ?Review of Systems  ?Constitutional:  Negative for diaphoresis and fatigue.  ?HENT:  Negative for hearing loss and tinnitus.   ?Eyes:  Positive for visual disturbance.  ?Respiratory:  Negative for shortness of breath.   ?Cardiovascular:  Negative for chest pain, palpitations and syncope.  ?Gastrointestinal:  Negative for blood in stool, diarrhea, nausea and vomiting.  ?Genitourinary:  Negative for dysuria and hematuria.  ?Neurological:  Positive for dizziness and headaches. Negative for weakness.  ? ?Physical Exam ?Updated Vital Signs ?BP (!) 193/97 (BP Location: Left Arm)   Pulse 62   Temp 98.1 ?F (36.7 ?C) (Oral)   Resp 12   Ht 5\' 6"  (1.676 m)   Wt 69.9 kg   SpO2 100%   BMI 24.86 kg/m?  ?Physical Exam ?Constitutional:   ?   General: He is not in acute distress. ?   Appearance: Normal appearance.  ?HENT:  ?   Head:  Normocephalic and atraumatic.  ?Eyes:  ?   Extraocular Movements: Extraocular movements intact.  ?   Pupils: Pupils are equal, round, and reactive to light.  ?Cardiovascular:  ?   Rate and Rhythm: Normal rate and regular rhythm.  ?   Pulses: Normal pulses.  ?   Heart sounds: Normal heart sounds. No murmur heard. ?Pulmonary:  ?   Effort: Pulmonary effort is normal. No respiratory distress.  ?   Breath sounds: Normal breath sounds. No wheezing, rhonchi or rales.  ?Abdominal:  ?   General: Bowel sounds are normal. There is no distension.  ?   Palpations:  Abdomen is soft.  ?   Tenderness: There is no abdominal tenderness.  ?Musculoskeletal:  ?   Right lower leg: No edema.  ?   Left lower leg: No edema.  ?Skin: ?   General: Skin is warm and dry.  ?Neurological:  ?   General: No focal deficit present.  ?   Mental Status: He is alert and oriented to person, place, and time. Mental status is at baseline.  ?   Cranial Nerves: No cranial nerve deficit.  ?   Motor: No weakness.  ?Psychiatric:     ?   Mood and Affect: Mood normal.     ?   Behavior: Behavior normal.  ? ? ?ED Results / Procedures / Treatments   ?Labs ?(all labs ordered are listed, but only abnormal results are displayed) ?Labs Reviewed  ?BASIC METABOLIC PANEL  ?CBC  ?CBG MONITORING, ED  ? ? ?EKG ?EKG Interpretation ? ?Date/Time:  Monday Feb 08 2022 06:04:18 EDT ?Ventricular Rate:  58 ?PR Interval:  173 ?QRS Duration: 81 ?QT Interval:  392 ?QTC Calculation: 385 ?R Axis:   75 ?Text Interpretation: Sinus rhythm No significant change since last tracing Confirmed by Zadie Rhine ([id number]) on 02/08/2022 6:11:09 AM ? ?Radiology ?No results found. ? ? ?Medications Ordered in ED ?Medications  ?lisinopril (ZESTRIL) tablet 10 mg (10 mg Oral Given 02/08/22 6045)  ? ? ?ED Course/ Medical Decision Making/ A&P ?  ?                        ?Medical Decision Making ?Amount and/or Complexity of Data Reviewed ?Labs: ordered. ? ?Risk ?Prescription drug management. ? ? ?Patient is a 56 yo male with hypertension who has been off of lisinopril 10 mg for months. He presented with one day of dizziness that has progressed to a headache with blurry vision. BP on arrival 222/96. ? ?CBC and BMP unremarkable- no anemia or AKI noted. EKG showed normal sinus rhythm.  ? ?Patient was given one dose of lisinopril 10 mg at 0630. BP 193/97 at 0724.  ? ?Considered CT head, however, patient has no neurologic deficits on exam. A&Ox3, CN II-XII intact, and strength is intact in bilateral upper and lower extremities. The rest of his physical exam was  unremarkable.  ? ?Discussed with patient and his friend in the room that his elevated BP is the likely culprit of his symptoms. Will discharge the patient with new prescription for lisinopril and recommend he follow up with his PCP, Dr. Sloan Leiter, [facility]. Patient is stable for discharge to home at this time.  ? ? ? ? ? ? ? ? ?Final Clinical Impression(s) / ED Diagnoses ?Final diagnoses:  ?Hypertensive urgency  ? ? ?Rx / DC Orders ?ED Discharge Orders   ? ? None  ? ?  ? ? ?  ?Ned Card,  Nakyah Erdmann N, DO ?02/08/22 0751 ? ?  ?Maia PlanLong, Joshua G, MD ?02/11/22 1115 ? ?

## 2022-02-08 NOTE — Discharge Instructions (Addendum)
Todd Allen, ? ?You were in the emergency department because of your dizziness and headache that was caused by your high blood pressure. Please take the lisinopril-HCTZ pill once a day and follow up with the internal medicine center at [facility]. Please call [phone] to make an appointment.  ?

## 2022-02-08 NOTE — ED Notes (Signed)
AVS provided to client, reviewed instructions from ED providers, informed that an electronic Rx was sent to the [facility]. Instructed client to inform pharmacy of last Lisinopril med given for proper dosing at home. Opportunity for questions provided prior to DC to home, wife at bedside ?

## 2022-02-08 NOTE — ED Provider Triage Note (Signed)
Emergency Medicine Provider Triage Evaluation Note ? ?Todd Allen , a 56 y.o. male  was evaluated in triage.  Pt complains of dizziness and elevated blood pressure. ? ?Review of Systems  ?Positive: dizziness ?Negative: Focal weakness ? ?Physical Exam  ?BP (!) 222/96 (BP Location: Right Arm)   Pulse 62   Temp 98.1 ?F (36.7 ?C) (Oral)   Resp 18   Ht 1.676 m (5\' 6" )   Wt 69.9 kg   SpO2 100%   BMI 24.86 kg/m?  ?Gen:   Awake, no distress   ?Resp:  Normal effort  ?MSK:   Moves extremities without difficulty  ?Other:  No arm/leg drift, no facial droop ? ?Medical Decision Making  ?Medically screening exam initiated at 6:27 AM.  Appropriate orders placed.  was informed that the remainder of the evaluation will be completed by another provider, this initial triage assessment does not replace that evaluation, and the importance of remaining in the ED until their evaluation is complete. ? ?Labs ordered.  He admits to medication noncompliance.  Will restart lisinopril.  Denies dizziness at this time ? ?  ?Todd Branch, MD ?02/08/22 [phone] ? ?

## 2022-02-15 ENCOUNTER — Encounter: Payer: Self-pay | Admitting: Internal Medicine

## 2022-02-17 ENCOUNTER — Encounter: Payer: Self-pay | Admitting: Internal Medicine

## 2022-03-12 ENCOUNTER — Encounter: Payer: Self-pay | Admitting: Student

## 2022-03-12 ENCOUNTER — Ambulatory Visit (INDEPENDENT_AMBULATORY_CARE_PROVIDER_SITE_OTHER): Payer: Self-pay | Admitting: Student

## 2022-03-12 ENCOUNTER — Other Ambulatory Visit (HOSPITAL_COMMUNITY): Payer: Self-pay

## 2022-03-12 DIAGNOSIS — I10 Essential (primary) hypertension: Secondary | ICD-10-CM

## 2022-03-12 DIAGNOSIS — Z599 Problem related to housing and economic circumstances, unspecified: Secondary | ICD-10-CM

## 2022-03-12 DIAGNOSIS — F17210 Nicotine dependence, cigarettes, uncomplicated: Secondary | ICD-10-CM

## 2022-03-12 DIAGNOSIS — Z720 Tobacco use: Secondary | ICD-10-CM

## 2022-03-12 MED ORDER — LISINOPRIL-HYDROCHLOROTHIAZIDE 10-12.5 MG PO TABS
1.0000 | ORAL_TABLET | Freq: Every day | ORAL | 3 refills | Status: DC
  Filled 2022-03-12: qty 30, 30d supply, fill #0
  Filled 2022-04-14: qty 30, 30d supply, fill #1

## 2022-03-12 NOTE — Assessment & Plan Note (Signed)
Patient reports that he recently bought health insurance.  He believes it is through [place] [employer] but needs to activate it.  Patient states his car is at home and he will try to activated on MyChart.  Patient will follow-up next week after insurance is on file for lab work and to complete care gaps.

## 2022-03-12 NOTE — Progress Notes (Signed)
Internal Medicine Clinic Attending  Case discussed with Dr. Kirke Corin  At the time of the visit.  We reviewed the resident's history and exam and pertinent patient test results.  I agree with the assessment, diagnosis, and plan of care documented in the resident's note.    Once patient has insurance, will check BMP and do other health maintenance testing. He is anticipating his insurance will come through in the next week. Lisinopril/HCTZ seems to be working well and will send to [facility] before he gets insured.

## 2022-03-12 NOTE — Patient Instructions (Signed)
Thank you, [name] for allowing [place] to provide your care today. Today, we discussed your blood pressure and your recommended screenings. Congratulations on getting your blood pressure under control. Continue taking your blood pressure medicine as prescribed. Make sure to get your insurance information on file and follow-up with me next week so we can get blood work and your recommended screenings done.   I have ordered the following medication/changed the following medications:  Refilled lisinopril HCTZ 10-12.5 mg daily  My Chart Access: [email or web address]  Please follow-up in 1 week with Dr. Kirke Corin  Please make sure to arrive 15 minutes prior to your next appointment. If you arrive late, you may be asked to reschedule.    We look forward to seeing you next time. Please call our clinic at [phone] if you have any questions or concerns. The best time to call is Monday-Friday from 9am-4pm, but there is someone available 24/7. If after hours or the weekend, call the main hospital number and ask for the Internal Medicine Resident On-Call. If you need medication refills, please notify your pharmacy one week in advance and they will send [place] a request.   Thank you for letting us take part in your care. Wishing you the best!  Steffanie Rainwater, MD 03/12/2022, 11:50 AM IM Resident, PGY-2 Duwayne Heck 41:10

## 2022-03-12 NOTE — Assessment & Plan Note (Signed)
Patient here for follow-up after recent ER visit a month ago for severe symptomatic hypotension. Patient presented with dizziness, headache and blurry vision and found to have systolic BP in the 220s.  He was discharged home on lisinopril-HCTZ 10-12.5 mg daily and instructed to follow-up with his PCP.  Patient reports that he stopped taking his blood pressure medication about a year ago because he felt fine.  Since getting back on the blood pressure medicine he is felt fine and has not had any more dizziness, blurry vision or headaches.  He also denies any shortness of breath, chest pain, numbness, tingling, weakness or palpitations.  Blood pressure is now very well controlled and at goal.  I will have patient come back in next week once his new insurance is on file for full physical for lab work and preventative screening. Vitals:   03/12/22 1125  BP: 128/71   Plan: -Refill lisinopril-HCTZ 10-12.5 mg daily -Follow-up in 1 week to check CBC, CMP, hepatitis C, HIV, lipid panel and referral for colonoscopy

## 2022-03-12 NOTE — Assessment & Plan Note (Signed)
Patient will need tobacco cessation counseling at next office visit.

## 2022-03-12 NOTE — Progress Notes (Signed)
   CC: ED follow-up  HPI:  [name] is a 56 y.o. male with PMH as below who presents to clinic today to follow-up after ER visit for severe asymptomatic hypertension a month ago. Please see problem based charting for evaluation, assessment and plan.  Past Medical History:  Diagnosis Date   Acid reflux    Hypertension     Review of Systems:  Constitutional: Negative for weight changes or fatigue Eyes: Negative for visual changes Respiratory: Negative for shortness of breath Cardiac: Negative for chest pain Abdomen: Negative for abdominal pain, constipation or diarrhea Neuro: Negative for numbness, tingling, headache or weakness  Physical Exam: General: Pleasant, well-appearing middle-age man.  No acute distress. Cardiac: RRR. No murmurs, rubs or gallops. No LE edema Respiratory: Lungs CTAB. No wheezing or crackles. Abdominal: Soft, symmetric and non tender. Normal BS. Skin: Warm, dry and intact without rashes or lesions Extremities: Atraumatic. Full ROM. Palpable radial and DP pulses. Neuro: A&O x 3. Moves all extremities. Normal sensation to gross touch. Psych: Appropriate mood and affect.  Vitals:   03/12/22 1125  BP: 128/71  Pulse: 74  Temp: 98.1 F (36.7 C)  TempSrc: Oral  SpO2: 100%  Weight: 148 lb (67.1 kg)  Height: 5\' 6"  (1.676 m)     Assessment & Plan:   See Encounters Tab for problem based charting.  Patient discussed with Dr.  Dani Gobble, MD, MPH

## 2022-03-19 ENCOUNTER — Ambulatory Visit (INDEPENDENT_AMBULATORY_CARE_PROVIDER_SITE_OTHER): Payer: 59 | Admitting: Student

## 2022-03-19 ENCOUNTER — Encounter: Payer: Self-pay | Admitting: Student

## 2022-03-19 VITALS — BP 134/79 | HR 76 | Temp 97.8°F | Ht 66.0 in | Wt 151.2 lb

## 2022-03-19 DIAGNOSIS — Z1159 Encounter for screening for other viral diseases: Secondary | ICD-10-CM | POA: Diagnosis not present

## 2022-03-19 DIAGNOSIS — E782 Mixed hyperlipidemia: Secondary | ICD-10-CM

## 2022-03-19 DIAGNOSIS — I10 Essential (primary) hypertension: Secondary | ICD-10-CM | POA: Diagnosis not present

## 2022-03-19 DIAGNOSIS — Z0000 Encounter for general adult medical examination without abnormal findings: Secondary | ICD-10-CM | POA: Insufficient documentation

## 2022-03-19 DIAGNOSIS — Z1211 Encounter for screening for malignant neoplasm of colon: Secondary | ICD-10-CM

## 2022-03-19 DIAGNOSIS — Z114 Encounter for screening for human immunodeficiency virus [HIV]: Secondary | ICD-10-CM

## 2022-03-19 DIAGNOSIS — Z23 Encounter for immunization: Secondary | ICD-10-CM

## 2022-03-19 DIAGNOSIS — F17210 Nicotine dependence, cigarettes, uncomplicated: Secondary | ICD-10-CM

## 2022-03-19 DIAGNOSIS — Z13220 Encounter for screening for lipoid disorders: Secondary | ICD-10-CM

## 2022-03-19 DIAGNOSIS — Z720 Tobacco use: Secondary | ICD-10-CM

## 2022-03-19 NOTE — Patient Instructions (Signed)
Thank you, [name] for allowing [place] to provide your care today. Today we discussed your blood pressure, tobacco use and your preventative screenings. Continue to think about stopping smoking to help decrease your risk of heart disease and COPD.  Blood pressure is well controlled, continue taking your medication as prescribed.  I have ordered the following labs for you:   Lab Orders         Lipid Profile         CMP14 + Anion Gap         HIV antibody (with reflex)         Hepatitis C Ab reflex to Quant PCR      I will call if any are abnormal. All of your labs can be accessed through "My Chart".  I have place a referrals to gastroenterology for colonoscopy  My Chart Access: [email or web address]  Please follow-up in August with me  Please make sure to arrive 15 minutes prior to your next appointment. If you arrive late, you may be asked to reschedule.    We look forward to seeing you next time. Please call our clinic at [phone] if you have any questions or concerns. The best time to call is Monday-Friday from 9am-4pm, but there is someone available 24/7. If after hours or the weekend, call the main hospital number and ask for the Internal Medicine Resident On-Call. If you need medication refills, please notify your pharmacy one week in advance and they will send [place] a request.   Thank you for letting us take part in your care. Wishing you the best!  Steffanie Rainwater, MD 03/19/2022, 11:18 AM IM Resident, PGY-2 [facility]wayne Heck 41:10

## 2022-03-19 NOTE — Assessment & Plan Note (Addendum)
Patient referred to GI for colonoscopy. Hepatitis C and HIV screening pending.

## 2022-03-19 NOTE — Assessment & Plan Note (Signed)
Blood pressure remained stable with a BP of 134/79 today.  Patient did not have any side effects from his blood pressure medication.  He denies any dizziness, blurry vision or headaches. -Continue lisinopril-HCTZ 10-12.5 mg daily -Follow-up CMP  Addendum: CMP showed normal LFTs but mild bump in creatinine to 1.28.  Patient informed of lab results and admitted that he does not drink enough water and was likely dehydrated.  Advised to drink at least 3 L of water per day.  -Repeat BMP at next office visit

## 2022-03-19 NOTE — Assessment & Plan Note (Signed)
Patient received Tdap vaccine today

## 2022-03-19 NOTE — Progress Notes (Signed)
   CC: 1 week follow-up  HPI:  [name] is a 56 y.o. male with PMH as below who presents to clinic to follow-up after recently establishing in this clinic. Please see problem based charting for evaluation, assessment and plan.  Past Medical History:  Diagnosis Date   Acid reflux    Hypertension     Review of Systems:  Constitutional: Negative for fever, weight changes or fatigue Eyes: Negative for visual changes Respiratory: Negative for shortness of breath Cardiac: Negative for chest pain Neuro: Negative for headache or weakness  Physical Exam: General: Pleasant, well-appearing middle-age man.  No acute distress. Cardiac: RRR. No murmurs, rubs or gallops. No LE edema Respiratory: Lungs CTAB. No wheezing or crackles. Skin: Warm, dry and intact without rashes or lesions Extremities: Atraumatic. Full ROM. Palpable radial and DP pulses. Neuro: A&O x 3. Moves all extremities.  Normal sensation to gross touch. Psych: Appropriate mood and affect.  Vitals:   03/19/22 1025  BP: 134/79  Pulse: 76  Temp: 97.8 F (36.6 C)  TempSrc: Oral  SpO2: 100%  Weight: 151 lb 3.2 oz (68.6 kg)  Height: 5\' 6"  (1.676 m)    Assessment & Plan:   See Encounters Tab for problem based charting.  Patient discussed with Dr. , MD, MPH

## 2022-03-19 NOTE — Assessment & Plan Note (Signed)
Patient reports he has been smoking cigarettes since he finished high school in his teens. He is now down to about 1 pack of cigarettes per week. States he is thinking about quitting but currently not there yet. Patient was educated on the resources available to him including oral medications, patches, gums and lozenges.  -Continue to assess readiness for smoking cessation at each office visit

## 2022-03-20 LAB — CMP14 + ANION GAP
ALT: 13 IU/L (ref 0–44)
AST: 18 IU/L (ref 0–40)
Albumin/Globulin Ratio: 1.6 (ref 1.2–2.2)
Albumin: 4.6 g/dL (ref 3.8–4.9)
Alkaline Phosphatase: 83 IU/L (ref 44–121)
Anion Gap: 17 mmol/L (ref 10.0–18.0)
BUN/Creatinine Ratio: 10 (ref 9–20)
BUN: 13 mg/dL (ref 6–24)
Bilirubin Total: 0.2 mg/dL (ref 0.0–1.2)
CO2: 27 mmol/L (ref 20–29)
Calcium: 9.8 mg/dL (ref 8.7–10.2)
Chloride: 96 mmol/L (ref 96–106)
Creatinine, Ser: 1.28 mg/dL — ABNORMAL HIGH (ref 0.76–1.27)
Globulin, Total: 2.9 g/dL (ref 1.5–4.5)
Glucose: 79 mg/dL (ref 70–99)
Potassium: 4.1 mmol/L (ref 3.5–5.2)
Sodium: 140 mmol/L (ref 134–144)
Total Protein: 7.5 g/dL (ref 6.0–8.5)
eGFR: 67 mL/min/{1.73_m2} (ref >59)

## 2022-03-20 LAB — HCV INTERPRETATION

## 2022-03-20 LAB — LIPID PANEL
Chol/HDL Ratio: 5.4 ratio — ABNORMAL HIGH (ref 0.0–5.0)
Cholesterol, Total: 221 mg/dL — ABNORMAL HIGH (ref 100–199)
HDL: 41 mg/dL (ref >39)
LDL Chol Calc (NIH): 159 mg/dL — ABNORMAL HIGH (ref 0–99)
Triglycerides: 116 mg/dL (ref 0–149)
VLDL Cholesterol Cal: 21 mg/dL (ref 5–40)

## 2022-03-20 LAB — HIV ANTIBODY (ROUTINE TESTING W REFLEX): HIV Screen 4th Generation wRfx: NONREACTIVE

## 2022-03-20 LAB — HCV AB W REFLEX TO QUANT PCR: HCV Ab: NONREACTIVE

## 2022-03-22 DIAGNOSIS — E785 Hyperlipidemia, unspecified: Secondary | ICD-10-CM | POA: Insufficient documentation

## 2022-03-22 MED ORDER — ROSUVASTATIN CALCIUM 20 MG PO TABS: 20.0000 mg | ORAL_TABLET | Freq: Every day | ORAL | 1 refills | Status: AC

## 2022-03-22 NOTE — Addendum Note (Signed)
Addended bySharrell Ku on: 03/22/2022 05:38 PM   Modules accepted: Orders

## 2022-03-22 NOTE — Progress Notes (Signed)
Internal Medicine Clinic Attending  Case discussed with Dr. Amponsah  At the time of the visit.  We reviewed the resident's history and exam and pertinent patient test results.  I agree with the assessment, diagnosis, and plan of care documented in the resident's note.  

## 2022-03-22 NOTE — Assessment & Plan Note (Signed)
Patient found to have abnormal lipid panel with total cholesterol 221, triglyceride 116, HDL 41 and LDL 159. Patient has a ASCVD score of 20.5% indicating the need for moderate to high intensity statin to decrease his cardiovascular risk. Patient informed about results and counseled on importance of lifestyle modifications and improving his cholesterol levels. Patient advised to decrease intake of red meat, fatty foods and processed foods.  Plan: -Start Crestor 20 mg daily -Lifestyle modifications with dietary changes and exercise -Lipid panel in 6 months

## 2022-04-14 ENCOUNTER — Other Ambulatory Visit (HOSPITAL_COMMUNITY): Payer: Self-pay

## 2022-06-07 ENCOUNTER — Encounter: Payer: Self-pay | Admitting: Internal Medicine

## 2022-06-07 ENCOUNTER — Other Ambulatory Visit: Payer: Self-pay

## 2022-06-07 ENCOUNTER — Ambulatory Visit (INDEPENDENT_AMBULATORY_CARE_PROVIDER_SITE_OTHER): Payer: 59 | Admitting: Internal Medicine

## 2022-06-07 VITALS — BP 118/70 | HR 78 | Temp 98.3°F | Resp 28 | Ht 66.0 in | Wt 149.1 lb

## 2022-06-07 DIAGNOSIS — M79601 Pain in right arm: Secondary | ICD-10-CM | POA: Diagnosis not present

## 2022-06-07 DIAGNOSIS — F17210 Nicotine dependence, cigarettes, uncomplicated: Secondary | ICD-10-CM | POA: Diagnosis not present

## 2022-06-07 DIAGNOSIS — I10 Essential (primary) hypertension: Secondary | ICD-10-CM | POA: Diagnosis not present

## 2022-06-07 DIAGNOSIS — E782 Mixed hyperlipidemia: Secondary | ICD-10-CM

## 2022-06-07 MED ORDER — LISINOPRIL-HYDROCHLOROTHIAZIDE 10-12.5 MG PO TABS: 1.0000 | ORAL_TABLET | Freq: Every day | ORAL | 3 refills | Status: AC

## 2022-06-07 NOTE — Progress Notes (Signed)
Internal Medicine Clinic Attending  Case discussed with Dr. Masters  At the time of the visit.  We reviewed the resident's history and exam and pertinent patient test results.  I agree with the assessment, diagnosis, and plan of care documented in the resident's note.  

## 2022-06-07 NOTE — Progress Notes (Signed)
Subjective:  CC: follow-up HTN/ HLD and chronic right arm pain  HPI:  [name] is a 56 y.o. male with a past medical history stated below and presents today for hypertension, hyperlipidemia, and right arm pain. Please see problem based assessment and plan for additional details.  Past Medical History:  Diagnosis Date   Acid reflux    Hyperlipidemia    Hypertension    Tobacco use disorder     Current Outpatient Medications on File Prior to Visit  Medication Sig Dispense Refill   rosuvastatin (CRESTOR) 20 MG tablet Take 1 tablet (20 mg total) by mouth daily. 90 tablet 1   [DISCONTINUED] dicyclomine (BENTYL) 20 MG tablet Take 1 tablet (20 mg total) by mouth 2 (two) times daily. (Patient not taking: Reported on 08/21/2015) 20 tablet 0   [DISCONTINUED] esomeprazole (NEXIUM) 40 MG capsule Take 1 capsule (40 mg total) by mouth daily. (Patient not taking: Reported on 08/21/2015) 30 capsule 0   No current facility-administered medications on file prior to visit.    Family History  Problem Relation Age of Onset   Hypertension Mother    Obstructive Sleep Apnea Brother     Social History   Socioeconomic History   Marital status: Married    Spouse name: Not on file   Number of children: Not on file   Years of education: Not on file   Highest education level: Not on file  Occupational History   Not on file  Tobacco Use   Smoking status: Every Day    Packs/day: 0.50    Types: Cigarettes   Smokeless tobacco: Never   Tobacco comments:    5. ppd  Substance and Sexual Activity   Alcohol use: No   Drug use: Yes    Types: Marijuana    Comment: yesterday   Sexual activity: Not on file  Other Topics Concern   Not on file  Social History Narrative   Not on file   Social Determinants of Health   Financial Resource Strain: Not on file  Food Insecurity: Not on file  Transportation Needs: Not on file  Physical Activity: Not on file  Stress: Not on file   Social Connections: Not on file  Intimate Partner Violence: Not on file    Review of Systems: ROS negative except for what is noted on the assessment and plan.  Objective:   Vitals:   06/07/22 0852  BP: 118/70  Pulse: 78  Resp: (!) 28  Temp: 98.3 F (36.8 C)  TempSrc: Oral  SpO2: 100%  Weight: 149 lb 1.6 oz (67.6 kg)  Height: 5\' 6"  (1.676 m)    Physical Exam: Constitutional: well-appearing, in no acute distress Neck: spurlings test negative Cardiovascular: regular rate and rhythm, no m/r/g Pulmonary/Chest: normal work of breathing on room air, lungs clear to auscultation bilaterally MSK: no erythema or swelling present. No tenderness to palpation along olecranon. He has full passive and active range of motion to his elbow. No muscle atrophy present. Muscle strength is 5/5 in elbow flexion/ extension with pronation and supination.  Skin: warm and dry   Assessment & Plan:  Hyperlipidemia Lipid Panel     Component Value Date/Time   CHOL 221 (H) 03/19/2022 1126   TRIG 116 03/19/2022 1126   HDL 41 03/19/2022 1126   CHOLHDL 5.4 (H) 03/19/2022 1126   LDLCALC 159 (H) 03/19/2022 1126   LABVLDL 21 03/19/2022 1126   Statin therapy was started 6/23 with ASCVD risk score 20.5%. He  has been adherent with rosuvastatin 20 mg. Denies myalgia or other side effects.  A/P: Recheck lipid panel today. Goal for primary prevention LDL<100. Print out given for dyslipidemia. Continue rosuvastatin 20 mg qd  Hypertension Current medication includes lisinopril-HCTZ 10-12.5 mg qd. [name] states that he is adherent with medications and denies dizziness. Creatinine had increased on BMP in 6/23, thought to be 2/2 to dehydration. A/P: -recheck BMP  -continue lisinopril-HCTZ 10-12.5 mg qd  Arm pain, anterior, right [name] presents with several months history of right anterior arm pain. He describes arm pain as sharp twinges that are present from biceps down to mid arm. Pain resolves on  its own after a few seconds. 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 Pain is not limiting his function at home or at work.  On exam, no erythema or swelling present. No tenderness to palpation along olecranon. He has full passive and active range of motion to his elbow. No muscle atrophy present. Muscle strength is 5/5 in elbow flexion/ extension with pronation and supination. Spurlings test negative. A: Differentials include neuropathy 2/2 to cubital tunnel syndrome versus pronator teres syndrome.  Less likely cervical radiculopathy. Pain could also be MSK however no focal tenderness or swelling present with puts this lower on differential. P: I talked with patient about pain likely being from inflammation leading to nerve compression. With infrequency of symptoms and full function, will treat with conservative measures including ice compresses and NSAIDS as needed. I discussed with patient if pain worsens or functions worsens to follow-up and we could consider EMG at that point.    Patient discussed with Dr. Wille Celeste Todd Allen, D.O. [facility]  PGY-2 Pager: [phone]  Phone: [phone] Date 06/07/2022  Time 10:15 AM

## 2022-06-07 NOTE — Patient Instructions (Addendum)
Thank you, Mr.Colon Branch for allowing [place] to provide your care today. Today we discussed .    Blood pressure Your blood pressure looks excellent in clinic today. Please continue taking your medications and I have sent in refills for you. We are repeating some blood work to check on your kidneys. I will call you with results.  Cholesterol You started a medication called rosuvastatin that helps decrease risk of heart attack and stroke. I am repeating cholesterol blood work today to see the levels.  Arm pain Your pain sounds like it is coming from the nerve in your arm. From our conversation it does not sound like it is hurting enough to impair your ability to function. If the pain worsens please let us know and we can further evaluate it.   Please reach out to [facility] to schedule colonoscopy. Gastroenterologist in [place], [place] Address: [address] [place], [place], [place] [address] Phone: [phone]   I have ordered the following labs for you:  Lab Orders         BMP8+Anion Gap         Lipid Profile       Referrals ordered today:   Referral Orders  No referral(s) requested today     I have ordered the following medication/changed the following medications:   Stop the following medications: Medications Discontinued During This Encounter  Medication Reason   lisinopril-hydrochlorothiazide (ZESTORETIC) 10-12.5 MG tablet Reorder     Start the following medications: Meds ordered this encounter  Medications   lisinopril-hydrochlorothiazide (ZESTORETIC) 10-12.5 MG tablet    Sig: Take 1 tablet by mouth daily.    Dispense:  90 tablet    Refill:  3    IM program     Follow up: 6 months unless your arm pain worsens  Remember: Changing your diet can also help with your cholesterol! I included some info on that in your packet.  We look forward to seeing you next time. Please call our clinic at [phone] if you have any questions or  concerns. The best time to call is Monday-Friday from 9am-4pm, but there is someone available 24/7. If after hours or the weekend, call the main hospital number and ask for the Internal Medicine Resident On-Call. If you need medication refills, please notify your pharmacy one week in advance and they will send [place] a request.   Thank you for trusting me with your care. Wishing you the best!   Rudene Christians, DO [facility]

## 2022-06-07 NOTE — Assessment & Plan Note (Addendum)
Todd Allen presents with several months history of right anterior arm pain. He describes arm pain as sharp twinges that are present from biceps down to mid arm. Pain resolves on its own after a few seconds. 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 Pain is not limiting his function at home or at work.  On exam, no erythema or swelling present. No tenderness to palpation along olecranon. He has full passive and active range of motion to his elbow. No muscle atrophy present. Muscle strength is 5/5 in elbow flexion/ extension with pronation and supination. Spurlings test negative. A: Differentials include neuropathy 2/2 to cubital tunnel syndrome versus pronator teres syndrome.  Less likely cervical radiculopathy. Pain could also be MSK however no focal tenderness or swelling present with puts this lower on differential. P: I talked with patient about pain likely being from inflammation leading to nerve compression. With infrequency of symptoms and full function, will treat with conservative measures including ice compresses and NSAIDS as needed. I discussed with patient if pain worsens or functions worsens to follow-up and we could consider EMG at that point.

## 2022-06-07 NOTE — Assessment & Plan Note (Signed)
Current medication includes lisinopril-HCTZ 10-12.5 mg qd. [name] states that he is adherent with medications and denies dizziness. Creatinine had increased on BMP in 6/23, thought to be 2/2 to dehydration. A/P: -recheck BMP  -continue lisinopril-HCTZ 10-12.5 mg qd

## 2022-06-07 NOTE — Assessment & Plan Note (Signed)
Lipid Panel     Component Value Date/Time   CHOL 221 (H) 03/19/2022 1126   TRIG 116 03/19/2022 1126   HDL 41 03/19/2022 1126   CHOLHDL 5.4 (H) 03/19/2022 1126   LDLCALC 159 (H) 03/19/2022 1126   LABVLDL 21 03/19/2022 1126   Statin therapy was started 6/23 with ASCVD risk score 20.5%. He has been adherent with rosuvastatin 20 mg. Denies myalgia or other side effects.  A/P: Recheck lipid panel today. Goal for primary prevention LDL<100. Print out given for dyslipidemia. Continue rosuvastatin 20 mg qd  Addendum 8/29: LDL a decreased from 159 to 129. I called and talked with patient. He states that he has been missing several doses per week. -continue rosuvastatin 20 mg, we talked about strategies to help him be more consistent with taking medication. -repeat lipid panel at follow-up

## 2022-06-08 LAB — BMP8+ANION GAP
Anion Gap: 10 mmol/L (ref 10.0–18.0)
BUN/Creatinine Ratio: 8 — ABNORMAL LOW (ref 9–20)
BUN: 10 mg/dL (ref 6–24)
CO2: 29 mmol/L (ref 20–29)
Calcium: 9.9 mg/dL (ref 8.7–10.2)
Chloride: 100 mmol/L (ref 96–106)
Creatinine, Ser: 1.21 mg/dL (ref 0.76–1.27)
Glucose: 67 mg/dL — ABNORMAL LOW (ref 70–99)
Potassium: 3.7 mmol/L (ref 3.5–5.2)
Sodium: 139 mmol/L (ref 134–144)
eGFR: 71 mL/min/{1.73_m2} (ref >59)

## 2022-06-08 LAB — LIPID PANEL
Chol/HDL Ratio: 5.2 ratio — ABNORMAL HIGH (ref 0.0–5.0)
Cholesterol, Total: 193 mg/dL (ref 100–199)
HDL: 37 mg/dL — ABNORMAL LOW (ref >39)
LDL Chol Calc (NIH): 129 mg/dL — ABNORMAL HIGH (ref 0–99)
Triglycerides: 152 mg/dL — ABNORMAL HIGH (ref 0–149)
VLDL Cholesterol Cal: 27 mg/dL (ref 5–40)

## 2023-07-22 ENCOUNTER — Encounter (HOSPITAL_COMMUNITY): Payer: Self-pay | Admitting: *Deleted

## 2023-07-22 ENCOUNTER — Ambulatory Visit (HOSPITAL_COMMUNITY)
Admission: EM | Admit: 2023-07-22 | Discharge: 2023-07-22 | Disposition: A | Discharge status: Home / Self Care | Payer: Self-pay | Attending: Internal Medicine | Admitting: Internal Medicine

## 2023-07-22 ENCOUNTER — Ambulatory Visit (INDEPENDENT_AMBULATORY_CARE_PROVIDER_SITE_OTHER): Payer: Self-pay

## 2023-07-22 DIAGNOSIS — M79672 Pain in left foot: Secondary | ICD-10-CM

## 2023-07-22 DIAGNOSIS — L84 Corns and callosities: Secondary | ICD-10-CM

## 2023-07-22 DIAGNOSIS — I10 Essential (primary) hypertension: Secondary | ICD-10-CM

## 2023-07-22 NOTE — Discharge Instructions (Addendum)
Your xrays of your left foot are negative for signs of foreign body/glass to the foot.  I suspect your symptoms are due to what we call a "corn" of the foot. Purchase Dr. Margart Sickles corn cushions.    Follow-up with [facility] in the next 3-4 days. Apply warm compresses to the foot.  Epsom salt soaks as needed.  If you develop any new or worsening symptoms or if your symptoms do not start to improve, please return here or follow-up with your primary care provider. If your symptoms are severe, please go to the emergency room.

## 2023-07-22 NOTE — ED Provider Notes (Addendum)
[facility]    CSN: [id number] Arrival date & time: 07/22/23  1019      History   Chief Complaint Chief Complaint  Patient presents with   Foot Pain    HPI Todd Allen is a 57 y.o. male.   Patient presents to urgent care for evaluation of pain to the ventral aspect of the left foot near the left heel that started a couple of days ago.  He suspects that this tenderness is coming from possible retained foreign body to the area from injury that occurred 1 to 2 years ago.  He remembers injury where he sustained puncture wound with glass to the foot.  At time of injury, he thought he was able to get all of the glass out of his foot but he is unsure.  Area started to become "black" and tender a couple of months ago but area has grown in tenderness over the last couple of days.  Wears steel toed shoes and lifts heavy objects on his feet all day for work.  He believes this is exacerbated his pain.  Pain is worse with ambulating and pressure.  No numbness or tingling to the toes, history of diabetes, or redness, swelling, or pus from the lesion.  He has never been seen by podiatrist for this.  Has not attempted use of any over-the-counter medications to help with symptoms PTA.     Past Medical History:  Diagnosis Date   Acid reflux    Hyperlipidemia    Hypertension    Tobacco use disorder     Patient Active Problem List   Diagnosis Date Noted   Arm pain, anterior, right 06/07/2022   Hyperlipidemia 03/22/2022   Healthcare maintenance 03/19/2022   Hypertension 06/30/2020   Tobacco use 06/30/2020   Need for influenza vaccination 06/30/2020   Need for financial support 06/30/2020    History reviewed. No pertinent surgical history.     Home Medications    Prior to Admission medications   Medication Sig Start Date End Date Taking? Authorizing Provider  lisinopril-hydrochlorothiazide (ZESTORETIC) 10-12.5 MG tablet Take 1 tablet by mouth daily. 06/07/22  07/22/23 Yes Masters, Katie, DO  rosuvastatin (CRESTOR) 20 MG tablet Take 1 tablet (20 mg total) by mouth daily. 03/22/22   Steffanie Rainwater, MD  dicyclomine (BENTYL) 20 MG tablet Take 1 tablet (20 mg total) by mouth 2 (two) times daily. Patient not taking: Reported on 08/21/2015 10/31/12 08/17/19  Mendel Ryder, MD  esomeprazole (NEXIUM) 40 MG capsule Take 1 capsule (40 mg total) by mouth daily. Patient not taking: Reported on 08/21/2015 10/31/12 08/17/19  Mendel Ryder, MD    Family History Family History  Problem Relation Age of Onset   Hypertension Mother    Obstructive Sleep Apnea Brother     Social History Social History   Tobacco Use   Smoking status: Every Day    Current packs/day: 0.50    Types: Cigarettes   Smokeless tobacco: Never   Tobacco comments:    5. ppd  Vaping Use   Vaping status: Never Used  Substance Use Topics   Alcohol use: No   Drug use: Yes    Types: Marijuana    Comment: yesterday     Allergies   Patient has no known allergies.   Review of Systems Review of Systems Per HPI  Physical Exam Triage Vital Signs ED Triage Vitals  Encounter Vitals Group     BP 07/22/23 1034 (!) 167/84  Systolic BP Percentile --      Diastolic BP Percentile --      Pulse Rate 07/22/23 1034 62     Resp 07/22/23 1034 18     Temp 07/22/23 1034 97.7 F (36.5 C)     Temp Source 07/22/23 1034 Oral     SpO2 --      Weight --      Height --      Head Circumference --      Peak Flow --      Pain Score 07/22/23 1032 10     Pain Loc --      Pain Education --      Exclude from Growth Chart --    No data found.  Updated Vital Signs BP (!) 167/84 (BP Location: Left Arm)   Pulse 62   Temp 97.7 F (36.5 C) (Oral)   Resp 18   Visual Acuity Right Eye Distance:   Left Eye Distance:   Bilateral Distance:    Right Eye Near:   Left Eye Near:    Bilateral Near:     Physical Exam Vitals and nursing note reviewed.  Constitutional:       Appearance: He is not ill-appearing or toxic-appearing.  HENT:     Head: Normocephalic and atraumatic.     Right Ear: Hearing and external ear normal.     Left Ear: Hearing and external ear normal.     Nose: Nose normal.     Mouth/Throat:     Lips: Pink.  Eyes:     General: Lids are normal. Vision grossly intact. Gaze aligned appropriately.     Extraocular Movements: Extraocular movements intact.     Conjunctiva/sclera: Conjunctivae normal.  Pulmonary:     Effort: Pulmonary effort is normal.  Musculoskeletal:     Cervical back: Neck supple.       Feet:  Feet:     Comments: +2 bilateral DP, sensation intact distally. Skin:    General: Skin is warm and dry.     Capillary Refill: Capillary refill takes less than 2 seconds.     Findings: No rash.  Neurological:     General: No focal deficit present.     Mental Status: He is alert and oriented to person, place, and time. Mental status is at baseline.     Cranial Nerves: No dysarthria or facial asymmetry.  Psychiatric:        Mood and Affect: Mood normal.        Speech: Speech normal.        Behavior: Behavior normal.        Thought Content: Thought content normal.        Judgment: Judgment normal.      UC Treatments / Results  Labs (all labs ordered are listed, but only abnormal results are displayed) Labs Reviewed - No data to display  EKG   Radiology No results found.  Procedures Procedures (including critical care time)  Medications Ordered in UC Medications - No data to display  Initial Impression / Assessment and Plan / UC Course  I have reviewed the triage vital signs and the nursing notes.  Pertinent labs & imaging results that were available during my care of the patient were reviewed by me and considered in my medical decision making (see chart for details).   1. Corn of foot, left foot pain, elevated BP reading with diagnosis of hypertension Presentation concerning for corn of left foot. No signs of  cellulitis/infection.  X-ray of left foot is unremarkable for signs of foreign body to the soft tissues and bony abnormality per my interpretation, will call patient if radiology reread shows different interpretation indicating change in treatment plan. Encouraged use of Tylenol as needed for pain. Dr. Margart Sickles corn pads may be purchased to alleviate pressure from area on foot. Walking referral to [facility] provided.  Elevated BP reading in clinic, takes lisinopril-HCTZ. No red flag signs/symptoms indicating need for referral to ED fur workup related to HTN. Advised to F/U with PCP.   Counseled patient on potential for adverse effects with medications prescribed/recommended today, strict ER and return-to-clinic precautions discussed, patient verbalized understanding.    Final Clinical Impressions(s) / UC Diagnoses   Final diagnoses:  Elevated blood pressure reading with diagnosis of hypertension  Corn of foot  Left foot pain     Discharge Instructions      Your xrays of your left foot are negative for signs of foreign body/glass to the foot.  I suspect your symptoms are due to what we call a "corn" of the foot. Purchase Dr. Margart Sickles corn cushions.    Follow-up with [facility] in the next 3-4 days. Apply warm compresses to the foot.  Epsom salt soaks as needed.  If you develop any new or worsening symptoms or if your symptoms do not start to improve, please return here or follow-up with your primary care provider. If your symptoms are severe, please go to the emergency room.     ED Prescriptions   None    PDMP not reviewed this encounter.   Carlisle Beers, FNP 07/22/23 1203    Carlisle Beers, FNP 07/22/23 [phone]

## 2023-07-22 NOTE — ED Triage Notes (Signed)
Pt states at the beginning of the year he stepped on glass with his left foot and thought he removed all of it. Since he has been working more he states that the area has turned black and is more painful. He states that sometimes his left leg also gets numb. Last TDAP 03/2022
